# Patient Record
Sex: Male | Born: 1960 | Race: White | Hispanic: No | Marital: Married | Smoking: Former smoker
Health system: Southern US, Community
[De-identification: ages and names within clinical notes are randomized; demographics above are authoritative.]

## PROBLEM LIST (undated history)

## (undated) DIAGNOSIS — G43909 Migraine, unspecified, not intractable, without status migrainosus: Secondary | ICD-10-CM

## (undated) DIAGNOSIS — I10 Essential (primary) hypertension: Secondary | ICD-10-CM

## (undated) DIAGNOSIS — K219 Gastro-esophageal reflux disease without esophagitis: Secondary | ICD-10-CM

## (undated) DIAGNOSIS — R42 Dizziness and giddiness: Secondary | ICD-10-CM

## (undated) DIAGNOSIS — J302 Other seasonal allergic rhinitis: Secondary | ICD-10-CM

## (undated) HISTORY — PX: RHINOPLASTY: SUR1284

## (undated) HISTORY — PX: HERNIA REPAIR: SHX51

---

## 2010-03-13 ENCOUNTER — Emergency Department (HOSPITAL_COMMUNITY): Admission: EM | Admit: 2010-03-13 | Discharge: 2010-03-13 | Payer: Self-pay | Admitting: Family Medicine

## 2010-08-09 ENCOUNTER — Ambulatory Visit: Payer: Self-pay | Admitting: Gastroenterology

## 2015-01-01 ENCOUNTER — Emergency Department (HOSPITAL_COMMUNITY)
Admission: EM | Admit: 2015-01-01 | Discharge: 2015-01-01 | Disposition: A | Discharge status: Home / Self Care | Payer: 59 | Source: Home / Self Care | Attending: Emergency Medicine | Admitting: Emergency Medicine

## 2015-01-01 ENCOUNTER — Encounter (HOSPITAL_COMMUNITY): Payer: Self-pay | Admitting: *Deleted

## 2015-01-01 DIAGNOSIS — N200 Calculus of kidney: Secondary | ICD-10-CM

## 2015-01-01 HISTORY — DX: Other seasonal allergic rhinitis: J30.2

## 2015-01-01 HISTORY — DX: Gastro-esophageal reflux disease without esophagitis: K21.9

## 2015-01-01 LAB — POCT URINALYSIS DIP (DEVICE)
Bilirubin Urine: NEGATIVE
Glucose, UA: NEGATIVE mg/dL
KETONES UR: NEGATIVE mg/dL
Leukocytes, UA: NEGATIVE
NITRITE: NEGATIVE
Protein, ur: NEGATIVE mg/dL
Specific Gravity, Urine: 1.02 (ref 1.005–1.030)
UROBILINOGEN UA: 0.2 mg/dL (ref 0.0–1.0)
pH: 5 (ref 5.0–8.0)

## 2015-01-01 MED ORDER — HYDROCODONE-ACETAMINOPHEN 5-325 MG PO TABS: 1.0000 | ORAL_TABLET | Freq: Four times a day (QID) | ORAL | Status: DC | PRN

## 2015-01-01 MED ORDER — TAMSULOSIN HCL 0.4 MG PO CAPS: 0.4000 mg | ORAL_CAPSULE | Freq: Every day | ORAL | Status: DC

## 2015-01-01 MED ORDER — IBUPROFEN 800 MG PO TABS: 800.0000 mg | ORAL_TABLET | Freq: Three times a day (TID) | ORAL | Status: DC | PRN

## 2015-01-01 NOTE — ED Notes (Signed)
Pt  Reports       l  Flank   Pain     Since  Early  This  Am   -   Pt  denys  Any  specefic  Injury            - pt is   Awake  And  Alert  And  Oriented  He  Ambulated  To room  With a  Steady  Fluid  Gait         He  Is  Awake  And  Alert  And  Oriented

## 2015-01-01 NOTE — ED Provider Notes (Signed)
CSN: [id number]     Arrival date & time 01/01/15  8768 History   First MD Initiated Contact with Patient 01/01/15 1053     Chief Complaint  Patient presents with  . Flank Pain   (Consider location/radiation/quality/duration/timing/severity/associated sxs/prior Treatment) HPI He is a 54 year old man here for evaluation of left flank pain. He states he had several hours of left flank pain on Wednesday. He didn't think anything of it if he has some intermittent issues with back pain. Then, this morning he was woken at 4 AM by left flank pain that radiated around his side. He also reports feeling the urge to urinate, but unable to pass urine. This persisted for several hours, and then improved spontaneously. He has been able to void without difficulty for the last several hours. No fevers or chills. No dysuria. He has not seen any gross blood in his urine.  Past Medical History  Diagnosis Date  . GERD (gastroesophageal reflux disease)   . Seasonal allergies    Past Surgical History  Procedure Laterality Date  . Hernia repair    . Rhinoplasty     No family history on file. History  Substance Use Topics  . Smoking status: Never Smoker   . Smokeless tobacco: Not on file  . Alcohol Use: No    Review of Systems As in history of present illness Allergies  Review of patient's allergies indicates no known allergies.  Home Medications   Prior to Admission medications   Medication Sig Start Date End Date Taking? Authorizing Provider  ATENOLOL PO Take by mouth.   Yes Historical Provider, MD  GLUCOSAMINE HCL PO Take by mouth.   Yes Historical Provider, MD  Omega-3 Fatty Acids (FISH OIL PO) Take by mouth.   Yes Historical Provider, MD  Rosuvastatin Calcium (CRESTOR PO) Take by mouth.   Yes Historical Provider, MD  HYDROcodone-acetaminophen (NORCO) 5-325 MG per tablet Take 1 tablet by mouth every 6 (six) hours as needed for moderate pain. 01/01/15   Melony Overly, MD  ibuprofen (ADVIL,MOTRIN)  800 MG tablet Take 1 tablet (800 mg total) by mouth every 8 (eight) hours as needed. 01/01/15   Melony Overly, MD  tamsulosin (FLOMAX) 0.4 MG CAPS capsule Take 1 capsule (0.4 mg total) by mouth daily. 01/01/15   Melony Overly, MD   There were no vitals taken for this visit. Physical Exam  Constitutional: He is oriented to person, place, and time. He appears well-developed and well-nourished. No distress.  Cardiovascular: Normal rate.   Pulmonary/Chest: Effort normal.  Abdominal: Soft. Bowel sounds are normal. He exhibits no distension. There is no tenderness. There is no rebound and no guarding.  No CVA tenderness  Neurological: He is alert and oriented to person, place, and time.    ED Course  Procedures (including critical care time) Labs Review Labs Reviewed  POCT URINALYSIS DIP (DEVICE) - Abnormal; Notable for the following:    Hgb urine dipstick MODERATE (*)    All other components within normal limits    Imaging Review No results found.   MDM   1. Kidney stone on left side    I suspect he spontaneously passed a kidney stone this morning. We'll treat with one week of Flomax. Ibuprofen and Norco for pain. Return precautions reviewed.    Melony Overly, MD 01/01/15 [phone]

## 2015-01-01 NOTE — Discharge Instructions (Signed)
It sounds like you had a kidney stone. I think you have likely passed a kidney stone. Just in case, take Flomax daily for 1 week. Use ibuprofen 800 mg every 8 hours as needed for pain. You can use the Norco every 4-6 hours as needed for severe pain. If that pain comes back and you are unable to control at home, you have vomiting, or you are unable to urinate for 12 hours, please go to [facility] emergency room.

## 2015-09-18 MED FILL — ROSUVASTATIN CALCIUM 40 MG: 40 | 90 days supply | Qty: 90 | Fill #2

## 2015-09-18 MED FILL — ATENOLOL 50 MG TABLET: 50 | 90 days supply | Qty: 90 | Fill #0

## 2015-09-18 MED FILL — PANTOPRAZOLE SOD DR 40 MG T: 40 | 90 days supply | Qty: 90 | Fill #1

## 2015-09-18 MED FILL — FENOFIBRATE 160 MG TABLET: 160 | 90 days supply | Qty: 90 | Fill #3

## 2015-10-31 MED FILL — BUTALBITAL/APAP/CAFFEINE TB: 50-325-40 | 50 days supply | Qty: 50 | Fill #1

## 2015-12-11 MED FILL — ATENOLOL 50 MG TABLET: 50 | 90 days supply | Qty: 90 | Fill #1

## 2015-12-11 MED FILL — FENOFIBRATE 160 MG TABLET: 160 | 90 days supply | Qty: 90 | Fill #0

## 2015-12-11 MED FILL — PANTOPRAZOLE SOD DR 40 MG T: 40 | 90 days supply | Qty: 90 | Fill #2

## 2015-12-11 MED FILL — ROSUVASTATIN CALCIUM 40 MG: 40 | 90 days supply | Qty: 90 | Fill #3

## 2016-01-01 DIAGNOSIS — H5203 Hypermetropia, bilateral: Secondary | ICD-10-CM | POA: Diagnosis not present

## 2016-02-26 DIAGNOSIS — J309 Allergic rhinitis, unspecified: Secondary | ICD-10-CM | POA: Diagnosis not present

## 2016-02-26 DIAGNOSIS — R739 Hyperglycemia, unspecified: Secondary | ICD-10-CM | POA: Diagnosis not present

## 2016-02-26 DIAGNOSIS — Z6830 Body mass index (BMI) 30.0-30.9, adult: Secondary | ICD-10-CM | POA: Diagnosis not present

## 2016-02-26 DIAGNOSIS — E668 Other obesity: Secondary | ICD-10-CM | POA: Diagnosis not present

## 2016-02-26 DIAGNOSIS — Z1389 Encounter for screening for other disorder: Secondary | ICD-10-CM | POA: Diagnosis not present

## 2016-02-26 DIAGNOSIS — I10 Essential (primary) hypertension: Secondary | ICD-10-CM | POA: Diagnosis not present

## 2016-02-26 DIAGNOSIS — R945 Abnormal results of liver function studies: Secondary | ICD-10-CM | POA: Diagnosis not present

## 2016-02-26 DIAGNOSIS — H9319 Tinnitus, unspecified ear: Secondary | ICD-10-CM | POA: Diagnosis not present

## 2016-02-26 DIAGNOSIS — E784 Other hyperlipidemia: Secondary | ICD-10-CM | POA: Diagnosis not present

## 2016-02-29 MED FILL — BUTALB-ACETAMIN-CAFF 50-325: 50-325-40 | 50 days supply | Qty: 50 | Fill #2

## 2016-02-29 MED FILL — FLUTICASONE PROP 50 MCG SPR: 50 | 90 days supply | Qty: 48 | Fill #0

## 2016-03-15 MED FILL — FENOFIBRATE 160 MG TABLET: 160 | 90 days supply | Qty: 90 | Fill #1

## 2016-03-15 MED FILL — PANTOPRAZOLE SOD DR 40 MG T: 40 | 90 days supply | Qty: 90 | Fill #3

## 2016-03-15 MED FILL — ATENOLOL 50 MG TABLET: 50 | 90 days supply | Qty: 90 | Fill #2

## 2016-03-15 MED FILL — ROSUVASTATIN CALCIUM 40 MG: 40 | 90 days supply | Qty: 90 | Fill #0

## 2016-06-17 MED FILL — ROSUVASTATIN CALCIUM 40 MG: 40 | 90 days supply | Qty: 90 | Fill #1

## 2016-06-17 MED FILL — PANTOPRAZOLE SOD DR 40 MG T: 40 | 90 days supply | Qty: 90 | Fill #0

## 2016-06-17 MED FILL — ATENOLOL 50 MG TABLET: 50 | 90 days supply | Qty: 90 | Fill #0

## 2016-06-17 MED FILL — FENOFIBRATE 160 MG TABLET: 160 | 90 days supply | Qty: 90 | Fill #2

## 2016-07-30 MED FILL — BUTALBITAL/APAP/CAFFEINE TB: 50-325-40 | 45 days supply | Qty: 45 | Fill #0

## 2016-08-28 DIAGNOSIS — I10 Essential (primary) hypertension: Secondary | ICD-10-CM | POA: Diagnosis not present

## 2016-08-28 DIAGNOSIS — Z125 Encounter for screening for malignant neoplasm of prostate: Secondary | ICD-10-CM | POA: Diagnosis not present

## 2016-08-28 DIAGNOSIS — R7309 Other abnormal glucose: Secondary | ICD-10-CM | POA: Diagnosis not present

## 2016-08-29 DIAGNOSIS — Z0000 Encounter for general adult medical examination without abnormal findings: Secondary | ICD-10-CM | POA: Diagnosis not present

## 2016-08-29 DIAGNOSIS — R945 Abnormal results of liver function studies: Secondary | ICD-10-CM | POA: Diagnosis not present

## 2016-08-29 DIAGNOSIS — J3089 Other allergic rhinitis: Secondary | ICD-10-CM | POA: Diagnosis not present

## 2016-08-29 DIAGNOSIS — H9313 Tinnitus, bilateral: Secondary | ICD-10-CM | POA: Diagnosis not present

## 2016-08-29 DIAGNOSIS — K219 Gastro-esophageal reflux disease without esophagitis: Secondary | ICD-10-CM | POA: Diagnosis not present

## 2016-08-29 DIAGNOSIS — Z1389 Encounter for screening for other disorder: Secondary | ICD-10-CM | POA: Diagnosis not present

## 2016-08-29 DIAGNOSIS — I10 Essential (primary) hypertension: Secondary | ICD-10-CM | POA: Diagnosis not present

## 2016-08-29 DIAGNOSIS — M79673 Pain in unspecified foot: Secondary | ICD-10-CM | POA: Diagnosis not present

## 2016-08-29 DIAGNOSIS — R7309 Other abnormal glucose: Secondary | ICD-10-CM | POA: Diagnosis not present

## 2016-08-29 DIAGNOSIS — E784 Other hyperlipidemia: Secondary | ICD-10-CM | POA: Diagnosis not present

## 2016-09-19 MED FILL — ROSUVASTATIN CALCIUM 40 MG: 40 | 90 days supply | Qty: 90 | Fill #2

## 2016-09-19 MED FILL — PANTOPRAZOLE SOD DR 40 MG T: 40 | 90 days supply | Qty: 90 | Fill #1

## 2016-09-19 MED FILL — FENOFIBRATE 160 MG TABLET: 160 | 90 days supply | Qty: 90 | Fill #3

## 2016-09-19 MED FILL — ATENOLOL 50 MG TABLET: 50 | 90 days supply | Qty: 90 | Fill #1

## 2016-12-25 MED FILL — BUTALBITAL/APAP/CAFFEINE TB: 50-325-40 | 45 days supply | Qty: 45 | Fill #1

## 2016-12-25 MED FILL — ROSUVASTATIN CALCIUM 40 MG: 40 | 90 days supply | Qty: 90 | Fill #3

## 2016-12-25 MED FILL — ATENOLOL 50 MG TABLET: 50 | 90 days supply | Qty: 90 | Fill #2

## 2016-12-25 MED FILL — FENOFIBRATE 160 MG TABLET: 160 | 90 days supply | Qty: 90 | Fill #0

## 2016-12-25 MED FILL — PANTOPRAZOLE SOD DR 40 MG T: 40 | 90 days supply | Qty: 90 | Fill #2

## 2017-03-03 DIAGNOSIS — I10 Essential (primary) hypertension: Secondary | ICD-10-CM | POA: Diagnosis not present

## 2017-03-03 DIAGNOSIS — E784 Other hyperlipidemia: Secondary | ICD-10-CM | POA: Diagnosis not present

## 2017-03-03 DIAGNOSIS — Z6831 Body mass index (BMI) 31.0-31.9, adult: Secondary | ICD-10-CM | POA: Diagnosis not present

## 2017-03-03 DIAGNOSIS — M79673 Pain in unspecified foot: Secondary | ICD-10-CM | POA: Diagnosis not present

## 2017-03-03 DIAGNOSIS — R51 Headache: Secondary | ICD-10-CM | POA: Diagnosis not present

## 2017-03-03 DIAGNOSIS — E668 Other obesity: Secondary | ICD-10-CM | POA: Diagnosis not present

## 2017-03-25 MED FILL — ROSUVASTATIN CALCIUM 40 MG: 40 | 90 days supply | Qty: 90 | Fill #0

## 2017-03-25 MED FILL — BUTALBITAL/APAP/CAFFEINE TB: 50-325-40 | 45 days supply | Qty: 45 | Fill #2

## 2017-03-25 MED FILL — FENOFIBRATE 160 MG TABLET: 160 | 90 days supply | Qty: 90 | Fill #1

## 2017-03-25 MED FILL — PANTOPRAZOLE SOD DR 40 MG T: 40 | 90 days supply | Qty: 90 | Fill #0

## 2017-03-25 MED FILL — ATENOLOL 50 MG TABLET: 50 | 90 days supply | Qty: 90 | Fill #0

## 2017-05-20 MED FILL — FLUTICASONE PROP 50 MCG SPR: 50 | 90 days supply | Qty: 48 | Fill #0

## 2017-06-30 MED FILL — ATENOLOL 50 MG TABLET: 50 | 90 days supply | Qty: 90 | Fill #1

## 2017-06-30 MED FILL — FENOFIBRATE 160 MG TABLET: 160 | 90 days supply | Qty: 90 | Fill #2

## 2017-06-30 MED FILL — PANTOPRAZOLE SOD DR 40 MG T: 40 | 90 days supply | Qty: 90 | Fill #1

## 2017-06-30 MED FILL — BUTALB-ACETAMIN-CAFF 50-325: 50-325-40 | 45 days supply | Qty: 45 | Fill #3

## 2017-06-30 MED FILL — ROSUVASTATIN CALCIUM 40 MG: 40 | 90 days supply | Qty: 90 | Fill #1

## 2017-07-28 ENCOUNTER — Ambulatory Visit (INDEPENDENT_AMBULATORY_CARE_PROVIDER_SITE_OTHER): Payer: 59

## 2017-07-28 ENCOUNTER — Ambulatory Visit: Payer: 59 | Admitting: Podiatry

## 2017-07-28 DIAGNOSIS — M722 Plantar fascial fibromatosis: Secondary | ICD-10-CM | POA: Diagnosis not present

## 2017-07-28 NOTE — Progress Notes (Signed)
   Subjective:    Patient ID: Jackson Vaughan, male    DOB: [birth year], 56 y.o.   MRN: [id number]  HPI: He presents today with his wife with a history of plantar fasciitis. States that his feet really bother him after he gets off work by the end of the day they are sore and tired. He goes on to say that he has developed pain in his legs and knees in the evening and he feels that this may be associated with his lack of control from his orthotics.he states that he is also noticed that his foot flattens out and turns out to the right as he walks. He is referring to his right foot. He shows me his orthotics today which are nothing more than the comminuted orthotics produced by his chiropractor. He states they seem to help but they do not alleviate all of his symptoms. He would like to consider a different orthotic. He states that he will has to wear safety shoes at work. He works for [employer].    Review of Systems  All other systems reviewed and are negative.      Objective:   Physical Exam : Vital signs are stable he is alert and oriented 3 in no apparent distress.pulses are strongly palpable. Neurologic sensorium is intact per Semmes-Weinstein monofilament. Deep tendon reflexes are brisk and equal symmetrical bilateral muscle strength is +5/5 dorsiflexors plantar flexors inverters and everters bilaterally. He has minimal reproduced production of pain today on palpation of medial calcaneal tubercles bilaterally. taneous evaluation does not demonstrate any type of cutaneous abnormality.uc      Assessment & Plan:  Plantar fasciitis and pronation.  Plan: He has an appointment with Liliane Channel to be casted for orthotics.

## 2017-07-30 ENCOUNTER — Ambulatory Visit: Payer: Self-pay | Admitting: Urology

## 2017-08-04 ENCOUNTER — Ambulatory Visit (INDEPENDENT_AMBULATORY_CARE_PROVIDER_SITE_OTHER): Payer: 59 | Admitting: Orthotics

## 2017-08-04 DIAGNOSIS — M722 Plantar fascial fibromatosis: Secondary | ICD-10-CM | POA: Diagnosis not present

## 2017-08-04 NOTE — Progress Notes (Signed)

## 2017-08-21 ENCOUNTER — Encounter: Payer: Self-pay | Admitting: Urology

## 2017-08-21 ENCOUNTER — Ambulatory Visit: Payer: 59 | Admitting: Urology

## 2017-08-21 VITALS — BP 141/78 | HR 64 | Ht 67.0 in | Wt 203.0 lb

## 2017-08-21 DIAGNOSIS — R6882 Decreased libido: Secondary | ICD-10-CM

## 2017-08-21 DIAGNOSIS — R5383 Other fatigue: Secondary | ICD-10-CM

## 2017-08-21 DIAGNOSIS — N528 Other male erectile dysfunction: Secondary | ICD-10-CM | POA: Diagnosis not present

## 2017-08-21 NOTE — Progress Notes (Signed)
Imaging  08/21/2017 3:25 PM   Jackson Vaughan 08-15-1961 [id number]   Chief Complaint  Patient presents with  . New Patient (Initial Visit)    HPI: 56 year old male presents for evaluation of decreased libido and erectile dysfunction.  He presents with an approximately 2-year history of difficulty achieving and maintaining erection.  He has partial erections which are typically firm enough for penetration however he will typically lose prior to ejaculation.  He has migraines and is reluctant to try PDE 5 inhibitors secondary to headaches.  He also complains of decreased libido, tiredness and fatigue.  He has not had a prior evaluation and has not had his testosterone level checked.  Organic risk factors include hypertension, beta-blocker medication, hyperlipidemia and previous smoking history.  He denies bothersome lower urinary tract symptoms.  He has nocturia x0-1.  He denies flank, abdominal, pelvic or scrotal pain.  Denies dysuria or gross hematuria.  Past urologic history remarkable for stone disease.   PMH: Past Medical History:  Diagnosis Date  . GERD (gastroesophageal reflux disease)   . Seasonal allergies     Surgical History: Past Surgical History:  Procedure Laterality Date  . HERNIA REPAIR    . RHINOPLASTY      Home Medications:  Allergies as of 08/21/2017   No Known Allergies     Medication List        Accurate as of 08/21/17  3:25 PM. Always use your most recent med list.          ATENOLOL PO Take by mouth.   CRESTOR PO Take by mouth.   fenofibrate 54 MG tablet Take 54 mg daily by mouth.   FISH OIL PO Take by mouth.   vitamin C 500 MG tablet Commonly known as:  ASCORBIC ACID Take 500 mg daily by mouth.   vitamin E 400 UNIT capsule Take 400 Units daily by mouth.       Allergies: No Known Allergies  Family History: Family History  Problem Relation Age of Onset  . Kidney cancer Neg Hx   . Kidney disease Neg Hx   . Prostate cancer Neg  Hx     Social History:  reports that  has never smoked. he has never used smokeless tobacco. He reports that he does not drink alcohol. His drug history is not on file.  ROS: UROLOGY Frequent Urination?: No Hard to postpone urination?: No Burning/pain with urination?: No Get up at night to urinate?: No Leakage of urine?: No Urine stream starts and stops?: No Trouble starting stream?: No Do you have to strain to urinate?: No Blood in urine?: No Urinary tract infection?: No Sexually transmitted disease?: No Injury to kidneys or bladder?: No Painful intercourse?: No Weak stream?: No Erection problems?: Yes Penile pain?: No  Gastrointestinal Nausea?: No Vomiting?: No Indigestion/heartburn?: Yes Diarrhea?: No Constipation?: No  Constitutional Fever: No Night sweats?: No Weight loss?: No Fatigue?: Yes  Skin Skin rash/lesions?: No Itching?: No  Eyes Blurred vision?: No Double vision?: No  Ears/Nose/Throat Sore throat?: No Sinus problems?: Yes  Hematologic/Lymphatic Swollen glands?: No Easy bruising?: No  Cardiovascular Leg swelling?: No Chest pain?: No  Respiratory Cough?: No Shortness of breath?: No  Endocrine Excessive thirst?: No  Musculoskeletal Back pain?: Yes Joint pain?: No  Neurological Headaches?: Yes Dizziness?: No  Psychologic Depression?: No Anxiety?: No  Physical Exam: BP (!) 141/78   Pulse 64   Ht 5\' 7"  (1.702 m)   Wt 203 lb (92.1 kg)   BMI 31.79 kg/m  Constitutional:  Alert and oriented, No acute distress. HEENT: [place] AT, moist mucus membranes.  Trachea midline, no masses. Cardiovascular: No clubbing, cyanosis, or edema. Respiratory: Normal respiratory effort, no increased work of breathing. GI: Abdomen is soft, nontender, nondistended, no abdominal masses GU: No CVA tenderness.  Penis without lesions; testes descended bilaterally without masses or tenderness.  Prostate 35 g, smooth without nodules. Skin: No rashes,  bruises or suspicious lesions. Lymph: No cervical or inguinal adenopathy. Neurologic: Grossly intact, no focal deficits, moving all 4 extremities. Psychiatric: Normal mood and affect.   Assessment & Plan:    1. Other male erectile dysfunction I discussed other PDE 5 inhibitors which have less side effects of headache/flushing however he wanted to hold off.  - Testosterone - Luteinizing hormone - Sex hormone binding globulin  2. Low libido He will return for an a.m. testosterone, LH,SHBG  - Testosterone - Luteinizing hormone - Sex hormone binding globulin  3. Fatigue, unspecified type  - Testosterone - Luteinizing hormone - Sex hormone binding globulin   [name], No Name [address] [place], [place] [address] [phone]

## 2017-08-27 ENCOUNTER — Other Ambulatory Visit: Payer: Self-pay

## 2017-08-27 ENCOUNTER — Other Ambulatory Visit: Payer: 59

## 2017-08-27 DIAGNOSIS — E349 Endocrine disorder, unspecified: Secondary | ICD-10-CM

## 2017-08-28 LAB — FSH/LH
FSH: 2.2 m[IU]/mL (ref 1.5–12.4)
LH: 5.4 m[IU]/mL (ref 1.7–8.6)

## 2017-08-28 LAB — TESTOSTERONE: TESTOSTERONE: 221 ng/dL — AB (ref 264–916)

## 2017-08-28 LAB — SEX HORMONE BINDING GLOBULIN: Sex Hormone Binding: 28.8 nmol/L (ref 19.3–76.4)

## 2017-09-03 ENCOUNTER — Telehealth: Payer: Self-pay

## 2017-09-03 NOTE — Telephone Encounter (Signed)
-----   Message from [name], MD sent at 09/02/2017  8:24 AM EST ----- Testosterone level was low at 221.  He will need a repeat AM testosterone level to verify and a follow-up appointment to discuss treatment options

## 2017-09-03 NOTE — Telephone Encounter (Signed)
Patient's wife notified , patient will call back to schedule apts

## 2017-09-04 ENCOUNTER — Other Ambulatory Visit: Payer: 59

## 2017-09-04 DIAGNOSIS — E349 Endocrine disorder, unspecified: Secondary | ICD-10-CM | POA: Diagnosis not present

## 2017-09-05 LAB — TESTOSTERONE: Testosterone: 185 ng/dL — ABNORMAL LOW (ref 264–916)

## 2017-09-08 ENCOUNTER — Ambulatory Visit: Payer: 59 | Admitting: Orthotics

## 2017-09-08 DIAGNOSIS — M722 Plantar fascial fibromatosis: Secondary | ICD-10-CM

## 2017-09-08 NOTE — Progress Notes (Signed)
Patient came in today to pick up custom made foot orthotics.  The goals were accomplished and the patient reported no dissatisfaction with said orthotics.  Patient was advised of breakin period and how to report any issues. 

## 2017-09-12 ENCOUNTER — Telehealth: Payer: Self-pay

## 2017-09-12 NOTE — Telephone Encounter (Signed)
-----   Message from [name], MD sent at 09/11/2017  9:18 AM EST ----- Repeat testosterone level is low.  Recommend follow-up appointment to discuss treatment options.

## 2017-09-12 NOTE — Telephone Encounter (Signed)
LMOM

## 2017-09-15 ENCOUNTER — Other Ambulatory Visit: Payer: 59

## 2017-09-15 DIAGNOSIS — R82998 Other abnormal findings in urine: Secondary | ICD-10-CM | POA: Diagnosis not present

## 2017-09-15 DIAGNOSIS — Z125 Encounter for screening for malignant neoplasm of prostate: Secondary | ICD-10-CM | POA: Diagnosis not present

## 2017-09-15 DIAGNOSIS — E7849 Other hyperlipidemia: Secondary | ICD-10-CM | POA: Diagnosis not present

## 2017-09-15 DIAGNOSIS — R7309 Other abnormal glucose: Secondary | ICD-10-CM | POA: Diagnosis not present

## 2017-09-15 DIAGNOSIS — I10 Essential (primary) hypertension: Secondary | ICD-10-CM | POA: Diagnosis not present

## 2017-09-15 DIAGNOSIS — Z0000 Encounter for general adult medical examination without abnormal findings: Secondary | ICD-10-CM | POA: Diagnosis not present

## 2017-09-16 NOTE — Telephone Encounter (Signed)
Pt returned call and appt made

## 2017-09-22 ENCOUNTER — Other Ambulatory Visit: Payer: Self-pay

## 2017-09-22 DIAGNOSIS — E668 Other obesity: Secondary | ICD-10-CM | POA: Diagnosis not present

## 2017-09-22 DIAGNOSIS — J3089 Other allergic rhinitis: Secondary | ICD-10-CM | POA: Diagnosis not present

## 2017-09-22 DIAGNOSIS — E7849 Other hyperlipidemia: Secondary | ICD-10-CM | POA: Diagnosis not present

## 2017-09-22 DIAGNOSIS — F5221 Male erectile disorder: Secondary | ICD-10-CM | POA: Diagnosis not present

## 2017-09-22 DIAGNOSIS — K219 Gastro-esophageal reflux disease without esophagitis: Secondary | ICD-10-CM | POA: Diagnosis not present

## 2017-09-22 DIAGNOSIS — R5383 Other fatigue: Secondary | ICD-10-CM | POA: Diagnosis not present

## 2017-09-22 DIAGNOSIS — I10 Essential (primary) hypertension: Secondary | ICD-10-CM | POA: Diagnosis not present

## 2017-09-22 DIAGNOSIS — R6882 Decreased libido: Secondary | ICD-10-CM | POA: Diagnosis not present

## 2017-09-22 DIAGNOSIS — Z1389 Encounter for screening for other disorder: Secondary | ICD-10-CM | POA: Diagnosis not present

## 2017-09-22 DIAGNOSIS — Z0000 Encounter for general adult medical examination without abnormal findings: Secondary | ICD-10-CM | POA: Diagnosis not present

## 2017-09-29 MED FILL — PANTOPRAZOLE SOD DR 40 MG T: 40 | 90 days supply | Qty: 90 | Fill #2

## 2017-09-29 MED FILL — FENOFIBRATE 160 MG TABLET: 160 | 90 days supply | Qty: 90 | Fill #3

## 2017-09-29 MED FILL — ROSUVASTATIN CALCIUM 40 MG: 40 | 90 days supply | Qty: 90 | Fill #2

## 2017-09-29 MED FILL — ATENOLOL 50 MG TABLET: 50 | 90 days supply | Qty: 90 | Fill #2

## 2017-10-06 ENCOUNTER — Encounter: Payer: Self-pay | Admitting: Urology

## 2017-10-06 ENCOUNTER — Ambulatory Visit: Payer: 59 | Admitting: Urology

## 2017-10-06 VITALS — BP 117/52 | HR 67 | Ht 67.0 in | Wt 201.0 lb

## 2017-10-06 DIAGNOSIS — E291 Testicular hypofunction: Secondary | ICD-10-CM | POA: Diagnosis not present

## 2017-10-06 MED ORDER — TESTOSTERONE CYPIONATE 200 MG/ML IM SOLN: 200.0000 mg | INTRAMUSCULAR | 0 refills | Status: DC

## 2017-10-06 NOTE — Progress Notes (Signed)
10/06/2017 3:22 PM   Jackson Vaughan Feb 06, 1961 [id number]  Referring provider: Shon Baton, MD [address] [place], [place] [address]  Chief Complaint  Patient presents with  . Hypogonadism    discuss treatment    HPI: 57 year old male presents for follow-up.  He was initially seen on 08/21/2017 complaining of decreased libido and erectile dysfunction. Testosterone levels were low at 221 and 185 ng/dL.  LH was normal at 5.4  PMH: Past Medical History:  Diagnosis Date  . GERD (gastroesophageal reflux disease)   . Seasonal allergies     Surgical History: Past Surgical History:  Procedure Laterality Date  . HERNIA REPAIR    . RHINOPLASTY      Home Medications:  Allergies as of 10/06/2017   No Known Allergies     Medication List        Accurate as of 10/06/17  3:22 PM. Always use your most recent med list.          ATENOLOL PO Take by mouth.   CRESTOR PO Take by mouth.   fenofibrate 160 MG tablet Take 160 mg by mouth daily.   FISH OIL PO Take by mouth.   pantoprazole 40 MG tablet Commonly known as:  PROTONIX Take 40 mg by mouth daily.   vitamin C 500 MG tablet Commonly known as:  ASCORBIC ACID Take 500 mg daily by mouth.   vitamin E 400 UNIT capsule Take 400 Units daily by mouth.       Allergies: No Known Allergies  Family History: Family History  Problem Relation Age of Onset  . Kidney cancer Neg Hx   . Kidney disease Neg Hx   . Prostate cancer Neg Hx     Social History:  reports that  has never smoked. he has never used smokeless tobacco. He reports that he does not drink alcohol. His drug history is not on file.  ROS: UROLOGY Frequent Urination?: No Hard to postpone urination?: No Burning/pain with urination?: No Get up at night to urinate?: No Leakage of urine?: No Urine stream starts and stops?: No Trouble starting stream?: No Do you have to strain to urinate?: No Blood in urine?: No Urinary tract infection?:  No Sexually transmitted disease?: No Injury to kidneys or bladder?: No Painful intercourse?: No Weak stream?: No Erection problems?: No Penile pain?: No  Gastrointestinal Nausea?: No Vomiting?: No Indigestion/heartburn?: No Diarrhea?: No Constipation?: No  Constitutional Fever: No Night sweats?: No Weight loss?: No Fatigue?: No  Skin Skin rash/lesions?: No Itching?: No  Eyes Blurred vision?: No Double vision?: No  Ears/Nose/Throat Sore throat?: No Sinus problems?: Yes  Hematologic/Lymphatic Swollen glands?: No Easy bruising?: No  Cardiovascular Leg swelling?: No Chest pain?: No  Respiratory Cough?: No Shortness of breath?: No  Endocrine Excessive thirst?: No  Musculoskeletal Back pain?: Yes Joint pain?: No  Neurological Headaches?: Yes Dizziness?: No  Psychologic Depression?: No Anxiety?: No  Physical Exam: BP (!) 117/52   Pulse 67   Ht 5\' 7"  (1.702 m)   Wt 201 lb (91.2 kg)   BMI 31.48 kg/m    Constitutional:  Alert and oriented, No acute distress. HEENT: [place] AT, moist mucus membranes.  Trachea midline, no masses. Cardiovascular: No clubbing, cyanosis, or edema. Respiratory: Normal respiratory effort, no increased work of breathing. GI: Abdomen is soft, nontender, nondistended, no abdominal masses GU: No CVA tenderness.  Skin: No rashes, bruises or suspicious lesions. Lymph: No cervical or inguinal adenopathy. Neurologic: Grossly intact, no focal deficits, moving all 4 extremities.  Psychiatric: Normal mood and affect.  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  He is interested in testosterone replacement therapy.  We discussed treatment options including topical testosterone and injections.  His wife is a Marine scientist he has elected injections.  It was stressed that testosterone replacement therapy may not resolve his erectile  dysfunction and he may need additional therapy.  Potential side effects of testosterone replacement were discussed including stimulation of benign prostatic growth with lower urinary tract symptoms; erythrocytosis; edema; gynecomastia; worsening sleep apnea; venous thromboembolism; testicular atrophy and infertility. Recent studies suggesting an increased incidence of heart attack and stroke in patients taking testosterone was discussed. He was informed there is conflicting evidence regarding the impact of testosterone therapy on cardiovascular risk. The theoretical risk of growth stimulation of an undetected prostate cancer was also discussed.  He was informed that current evidence does not provide any definitive answers regarding the risks of testosterone therapy on prostate cancer and cardiovascular disease. The need for periodic monitoring of his testosterone level, PSA, hematocrit and DRE was discussed.  Will start at 200 mg every 2 weeks.  Follow-up 6 weeks for symptom reassessment and testosterone level.    [name], [place] [address], [place], [place] [address] [phone]

## 2017-10-24 MED FILL — TESTOSTERONE CYP 200 MG/ML: 200 | 84 days supply | Qty: 6 | Fill #0

## 2017-11-18 ENCOUNTER — Ambulatory Visit: Payer: 59 | Admitting: Urology

## 2017-11-25 ENCOUNTER — Other Ambulatory Visit: Payer: 59

## 2017-12-01 ENCOUNTER — Ambulatory Visit: Payer: 59 | Admitting: Urology

## 2017-12-22 ENCOUNTER — Other Ambulatory Visit: Payer: 59

## 2017-12-22 DIAGNOSIS — E291 Testicular hypofunction: Secondary | ICD-10-CM

## 2017-12-23 LAB — TESTOSTERONE: TESTOSTERONE: 816 ng/dL (ref 264–916)

## 2017-12-25 MED FILL — FENOFIBRATE 160 MG TABLET: 160 | 90 days supply | Qty: 90 | Fill #0

## 2017-12-25 MED FILL — ROSUVASTATIN CALCIUM 40 MG: 40 | 90 days supply | Qty: 90 | Fill #0

## 2017-12-25 MED FILL — ATENOLOL 50 MG TABLET: 50 | 90 days supply | Qty: 90 | Fill #0

## 2017-12-25 MED FILL — PANTOPRAZOLE SOD DR 40 MG T: 40 | 90 days supply | Qty: 90 | Fill #0

## 2017-12-29 ENCOUNTER — Ambulatory Visit: Payer: 59 | Admitting: Urology

## 2017-12-29 ENCOUNTER — Encounter: Payer: Self-pay | Admitting: Urology

## 2017-12-29 DIAGNOSIS — E291 Testicular hypofunction: Secondary | ICD-10-CM | POA: Diagnosis not present

## 2017-12-29 NOTE — Progress Notes (Signed)
12/29/2017 10:54 AM   Jackson Vaughan July 14, 1961 [id number]  Referring provider: Shon Baton, MD [address] [place], [place] [address]  Chief Complaint  Patient presents with  . Follow-up    HPI: 57 year old male presents for follow-up of hypogonadism.  He was previously seen for symptoms of decreased libido and erectile dysfunction.  He complained of decreased libido and erectile dysfunction.  He is currently on 200 mg every 2 weeks.  A testosterone level checked 1 week after his last injection was 816 ng/dL.  He feels his symptoms have improved.  He has noted increased appetite and weight gain and is wondering if this could be secondary to the testosterone.   PMH: Past Medical History:  Diagnosis Date  . GERD (gastroesophageal reflux disease)   . Seasonal allergies     Surgical History: Past Surgical History:  Procedure Laterality Date  . HERNIA REPAIR    . RHINOPLASTY      Home Medications:  Allergies as of 12/29/2017   No Known Allergies     Medication List        Accurate as of 12/29/17 10:54 AM. Always use your most recent med list.          ATENOLOL PO Take by mouth.   CRESTOR PO Take by mouth.   fenofibrate 160 MG tablet Take 160 mg by mouth daily.   FISH OIL PO Take by mouth.   pantoprazole 40 MG tablet Commonly known as:  PROTONIX Take 40 mg by mouth daily.   testosterone cypionate 200 MG/ML injection Commonly known as:  DEPOTESTOSTERONE CYPIONATE Inject 1 mL (200 mg total) into the muscle every 14 (fourteen) days.   vitamin C 500 MG tablet Commonly known as:  ASCORBIC ACID Take 500 mg daily by mouth.   vitamin E 400 UNIT capsule Take 400 Units daily by mouth.       Allergies: No Known Allergies  Family History: Family History  Problem Relation Age of Onset  . Kidney cancer Neg Hx   . Kidney disease Neg Hx   . Prostate cancer Neg Hx     Social History:  reports that he has never smoked. He has never used smokeless  tobacco. He reports that he does not drink alcohol. His drug history is not on file.  ROS: UROLOGY Frequent Urination?: No Hard to postpone urination?: No Burning/pain with urination?: No Get up at night to urinate?: No Leakage of urine?: No Urine stream starts and stops?: No Trouble starting stream?: No Do you have to strain to urinate?: No Blood in urine?: No Urinary tract infection?: No Sexually transmitted disease?: No Injury to kidneys or bladder?: No Painful intercourse?: No Weak stream?: No Erection problems?: No Penile pain?: No  Gastrointestinal Nausea?: No Vomiting?: No Indigestion/heartburn?: No Diarrhea?: No Constipation?: No  Constitutional Fever: No Night sweats?: No Weight loss?: No Fatigue?: No  Skin Skin rash/lesions?: No Itching?: No  Eyes Blurred vision?: No Double vision?: No  Ears/Nose/Throat Sore throat?: No Sinus problems?: No  Hematologic/Lymphatic Swollen glands?: No Easy bruising?: No  Cardiovascular Leg swelling?: No Chest pain?: No  Respiratory Cough?: No Shortness of breath?: No  Endocrine Excessive thirst?: No  Musculoskeletal Back pain?: No Joint pain?: No  Neurological Headaches?: No Dizziness?: No  Psychologic Depression?: No Anxiety?: No  Physical Exam: BP 133/80   Pulse 60   Resp 16   Ht 5\' 7"  (1.702 m)   Wt 208 lb 12.8 oz (94.7 kg)   SpO2 98%   BMI  32.70 kg/m   Constitutional:  Alert and oriented, No acute distress. HEENT: [place] AT, moist mucus membranes.  Trachea midline, no masses. Cardiovascular: No clubbing, cyanosis, or edema. Respiratory: Normal respiratory effort, no increased work of breathing. GI: Abdomen is soft, nontender, nondistended, no abdominal masses GU: No CVA tenderness.  Prostate 35 g, smooth without nodules. Lymph: No cervical or inguinal lymphadenopathy. Skin: No rashes, bruises or suspicious lesions. Neurologic: Grossly intact, no focal deficits, moving all 4  extremities. Psychiatric: Normal mood and affect.  Laboratory Data:  Lab Results  Component Value Date   TESTOSTERONE 816 12/22/2017    Assessment & Plan:   Significant improvement in his testosterone level.  He desires to continue replacement.  He will be due for a testosterone level, PSA and hematocrit in approximately 6 weeks.  Follow-up with me in 6 months.   [name], [place] [address], [place], [place] [address] [phone]

## 2017-12-30 MED FILL — TESTOSTERONE CYP 200 MG/ML: 200 | 55 days supply | Qty: 4 | Fill #1

## 2017-12-30 MED FILL — FLUTICASONE PROP 50 MCG SPR: 50 | 90 days supply | Qty: 48 | Fill #1

## 2018-01-15 MED FILL — BUTALB-ACETAMIN-CAFF 50-325: 50-325-40 | 45 days supply | Qty: 45 | Fill #0

## 2018-03-09 DIAGNOSIS — R739 Hyperglycemia, unspecified: Secondary | ICD-10-CM | POA: Diagnosis not present

## 2018-03-09 DIAGNOSIS — R5383 Other fatigue: Secondary | ICD-10-CM | POA: Diagnosis not present

## 2018-03-09 DIAGNOSIS — I10 Essential (primary) hypertension: Secondary | ICD-10-CM | POA: Diagnosis not present

## 2018-03-09 DIAGNOSIS — F5221 Male erectile disorder: Secondary | ICD-10-CM | POA: Diagnosis not present

## 2018-03-09 DIAGNOSIS — E7849 Other hyperlipidemia: Secondary | ICD-10-CM | POA: Diagnosis not present

## 2018-03-09 DIAGNOSIS — R6882 Decreased libido: Secondary | ICD-10-CM | POA: Diagnosis not present

## 2018-03-09 DIAGNOSIS — E298 Other testicular dysfunction: Secondary | ICD-10-CM | POA: Diagnosis not present

## 2018-03-09 DIAGNOSIS — Z23 Encounter for immunization: Secondary | ICD-10-CM | POA: Diagnosis not present

## 2018-03-09 DIAGNOSIS — E668 Other obesity: Secondary | ICD-10-CM | POA: Diagnosis not present

## 2018-03-18 ENCOUNTER — Other Ambulatory Visit: Payer: Self-pay | Admitting: Family Medicine

## 2018-03-18 MED FILL — PANTOPRAZOLE SOD DR 40 MG T: 40 | 90 days supply | Qty: 90 | Fill #1

## 2018-03-18 MED FILL — FENOFIBRATE 160 MG TABLET: 160 | 90 days supply | Qty: 90 | Fill #1

## 2018-03-18 MED FILL — ATENOLOL 50 MG TABLET: 50 | 90 days supply | Qty: 90 | Fill #1

## 2018-03-18 MED FILL — ROSUVASTATIN CALCIUM 40 MG: 40 | 90 days supply | Qty: 90 | Fill #1

## 2018-03-21 MED ORDER — TESTOSTERONE CYPIONATE 200 MG/ML IM SOLN
200.0000 mg | INTRAMUSCULAR | 0 refills | Status: DC
Start: 2018-03-21 — End: 2018-09-23

## 2018-03-23 ENCOUNTER — Other Ambulatory Visit: Payer: 59

## 2018-03-23 MED FILL — TESTOSTERONE CYP 200 MG/ML: 200 | 84 days supply | Qty: 6 | Fill #0

## 2018-03-26 ENCOUNTER — Other Ambulatory Visit: Payer: 59

## 2018-03-26 DIAGNOSIS — E291 Testicular hypofunction: Secondary | ICD-10-CM | POA: Diagnosis not present

## 2018-03-27 LAB — HEMATOCRIT: Hematocrit: 47.4 % (ref 37.5–51.0)

## 2018-03-27 LAB — TESTOSTERONE: TESTOSTERONE: 384 ng/dL (ref 264–916)

## 2018-03-27 LAB — PSA: PROSTATE SPECIFIC AG, SERUM: 1.5 ng/mL (ref 0.0–4.0)

## 2018-03-30 ENCOUNTER — Telehealth: Payer: Self-pay | Admitting: Family Medicine

## 2018-03-30 NOTE — Telephone Encounter (Signed)
LMOM for patient to return call.

## 2018-03-30 NOTE — Telephone Encounter (Signed)
-----   Message from [name], MD sent at 03/28/2018  1:58 PM EDT ----- PSA was 1.5; T level 384; hct nml Keep oct f/u appt

## 2018-04-01 NOTE — Telephone Encounter (Signed)
Pt informed

## 2018-05-13 MED FILL — BUTALB-ACETAMIN-CAFF 50-325: 50-325-40 | 45 days supply | Qty: 45 | Fill #1

## 2018-06-29 ENCOUNTER — Encounter: Payer: Self-pay | Admitting: Urology

## 2018-06-29 ENCOUNTER — Ambulatory Visit: Payer: 59 | Admitting: Urology

## 2018-06-29 VITALS — BP 130/83 | HR 76 | Ht 67.0 in | Wt 195.0 lb

## 2018-06-29 DIAGNOSIS — E291 Testicular hypofunction: Secondary | ICD-10-CM | POA: Diagnosis not present

## 2018-06-29 MED FILL — ROSUVASTATIN CALCIUM 40 MG: 40 | 90 days supply | Qty: 90 | Fill #2

## 2018-06-29 MED FILL — PANTOPRAZOLE SOD DR 40 MG T: 40 | 90 days supply | Qty: 90 | Fill #2

## 2018-06-29 MED FILL — FENOFIBRATE 160 MG TABLET: 160 | 90 days supply | Qty: 90 | Fill #2

## 2018-06-29 MED FILL — TESTOSTERONE CYP 200 MG/ML: 200 | 55 days supply | Qty: 4 | Fill #1

## 2018-06-29 MED FILL — ATENOLOL 50 MG TABLET: 50 | 90 days supply | Qty: 90 | Fill #2

## 2018-06-30 LAB — HEMATOCRIT: HEMATOCRIT: 45.4 % (ref 37.5–51.0)

## 2018-06-30 LAB — PSA: Prostate Specific Ag, Serum: 1.2 ng/mL (ref 0.0–4.0)

## 2018-06-30 LAB — TESTOSTERONE: TESTOSTERONE: 500 ng/dL (ref 264–916)

## 2018-06-30 NOTE — Progress Notes (Signed)
06/29/2018 1:47 PM   Jackson Vaughan [birth year] [id number]  Referring provider: Shon Baton, MD [address] [place], [place] [address]  Chief Complaint  Patient presents with  . Follow-up    HPI: 57 year old male presents for follow-up of hypogonadism.  He remains on IM testosterone 200 mg every 2 weeks.  Lab work July 2019 remarkable for a testosterone level at 384 ng/dL, PSA 1.5 and hematocrit 47.4.  He has good libido.  He notes some increased irritability on occasions.  His wife states he will have difficulty maintaining an erection until orgasm however he is able to maintain with manual stimulation.  He denies breast tenderness/enlargement.  He denies bothersome lower urinary tract symptoms.   PMH: Past Medical History:  Diagnosis Date  . GERD (gastroesophageal reflux disease)   . Seasonal allergies     Surgical History: Past Surgical History:  Procedure Laterality Date  . HERNIA REPAIR    . RHINOPLASTY      Home Medications:  Allergies as of 06/29/2018   No Known Allergies     Medication List        Accurate as of 06/29/18 11:59 PM. Always use your most recent med list.          ATENOLOL PO Take by mouth.   butalbital-acetaminophen-caffeine 50-325-40 MG tablet Commonly known as:  FIORICET, ESGIC   CRESTOR PO Take by mouth.   fenofibrate 160 MG tablet Take 160 mg by mouth daily.   FISH OIL PO Take by mouth.   pantoprazole 40 MG tablet Commonly known as:  PROTONIX Take 40 mg by mouth daily.   testosterone cypionate 200 MG/ML injection Commonly known as:  DEPOTESTOSTERONE CYPIONATE Inject 1 mL (200 mg total) into the muscle every 14 (fourteen) days.   vitamin C 500 MG tablet Commonly known as:  ASCORBIC ACID Take 500 mg daily by mouth.   vitamin E 400 UNIT capsule Take 400 Units daily by mouth.       Allergies: No Known Allergies  Family History: Family History  Problem Relation Age of Onset  . Kidney cancer Neg Hx   . Kidney  disease Neg Hx   . Prostate cancer Neg Hx     Social History:  reports that he has never smoked. He has never used smokeless tobacco. He reports that he does not drink alcohol or use drugs.  ROS: UROLOGY Frequent Urination?: No Hard to postpone urination?: No Burning/pain with urination?: No Get up at night to urinate?: No Leakage of urine?: No Urine stream starts and stops?: No Trouble starting stream?: No Do you have to strain to urinate?: No Blood in urine?: No Urinary tract infection?: No Sexually transmitted disease?: No Injury to kidneys or bladder?: No Painful intercourse?: No Weak stream?: No Erection problems?: No Penile pain?: No  Gastrointestinal Nausea?: No Vomiting?: No Indigestion/heartburn?: No Diarrhea?: No Constipation?: No  Constitutional Fever: No Night sweats?: No Weight loss?: No Fatigue?: No  Skin Skin rash/lesions?: No Itching?: No  Eyes Blurred vision?: No Double vision?: No  Ears/Nose/Throat Sore throat?: No Sinus problems?: No  Hematologic/Lymphatic Swollen glands?: No Easy bruising?: No  Cardiovascular Leg swelling?: No Chest pain?: No  Respiratory Cough?: No Shortness of breath?: No  Endocrine Excessive thirst?: No  Musculoskeletal Back pain?: No Joint pain?: No  Neurological Headaches?: No Dizziness?: No  Psychologic Depression?: No Anxiety?: No  Physical Exam: BP 130/83 (BP Location: Left Arm, Patient Position: Sitting, Cuff Size: Large)   Pulse 76   Ht 5\' 7"  (  1.702 m)   Wt 195 lb (88.5 kg)   BMI 30.54 kg/m   Constitutional:  Alert and oriented, No acute distress. HEENT: [place] AT, moist mucus membranes.  Trachea midline, no masses. Cardiovascular: No clubbing, cyanosis, or edema. Respiratory: Normal respiratory effort, no increased work of breathing. GI: Abdomen is soft, nontender, nondistended, no abdominal masses GU: No CVA tenderness.  Prostate 45 g, smooth without nodules. Lymph: No cervical or  inguinal lymphadenopathy. Skin: No rashes, bruises or suspicious lesions. Neurologic: Grossly intact, no focal deficits, moving all 4 extremities. Psychiatric: Normal mood and affect.   Assessment & Plan:   57 year old male doing well on TRT.  His last injection was approximately 1 week ago.  Blood was drawn for a testosterone, PSA and hematocrit.  If stable he will follow-up in 6 months.    [name], [place] [address] [place], [place] [address] [phone]

## 2018-07-01 ENCOUNTER — Telehealth: Payer: Self-pay

## 2018-07-01 NOTE — Telephone Encounter (Signed)
Left detailed message.   

## 2018-07-01 NOTE — Telephone Encounter (Signed)
-----   Message from [name], MD sent at 07/01/2018  8:13 AM EDT ----- PSA stable at 1.2.  Testosterone level looks good at 500.  Hematocrit was normal.

## 2018-07-03 ENCOUNTER — Other Ambulatory Visit: Payer: Self-pay | Admitting: Urology

## 2018-07-03 MED ORDER — SILDENAFIL CITRATE 20 MG PO TABS
ORAL_TABLET | ORAL | 0 refills | Status: DC
Start: 2018-07-03 — End: 2019-02-18

## 2018-07-03 MED FILL — SILDENAFIL CITRATE 20 MG TA: 20 | 6 days supply | Qty: 30 | Fill #0

## 2018-07-03 NOTE — Progress Notes (Signed)
Requests trial sildenafil for ED

## 2018-07-06 MED FILL — FLUTICASONE PROP 50 MCG SPR: 50 | 90 days supply | Qty: 48 | Fill #0

## 2018-09-15 DIAGNOSIS — Z0000 Encounter for general adult medical examination without abnormal findings: Secondary | ICD-10-CM | POA: Diagnosis not present

## 2018-09-15 DIAGNOSIS — R5383 Other fatigue: Secondary | ICD-10-CM | POA: Diagnosis not present

## 2018-09-15 DIAGNOSIS — R7309 Other abnormal glucose: Secondary | ICD-10-CM | POA: Diagnosis not present

## 2018-09-15 DIAGNOSIS — Z125 Encounter for screening for malignant neoplasm of prostate: Secondary | ICD-10-CM | POA: Diagnosis not present

## 2018-09-15 DIAGNOSIS — I10 Essential (primary) hypertension: Secondary | ICD-10-CM | POA: Diagnosis not present

## 2018-09-22 ENCOUNTER — Telehealth: Payer: Self-pay

## 2018-09-22 NOTE — Telephone Encounter (Signed)
Requesting refill on Testosterone.

## 2018-09-23 MED ORDER — TESTOSTERONE CYPIONATE 200 MG/ML IM SOLN: 200.0000 mg | INTRAMUSCULAR | 0 refills | Status: DC

## 2018-09-23 MED FILL — TESTOSTERONE CYP 200 MG/ML: 200 | 84 days supply | Qty: 6 | Fill #0

## 2018-09-23 NOTE — Telephone Encounter (Signed)
Rx sent 

## 2018-09-24 DIAGNOSIS — Z1389 Encounter for screening for other disorder: Secondary | ICD-10-CM | POA: Diagnosis not present

## 2018-09-24 DIAGNOSIS — I10 Essential (primary) hypertension: Secondary | ICD-10-CM | POA: Diagnosis not present

## 2018-09-24 DIAGNOSIS — R945 Abnormal results of liver function studies: Secondary | ICD-10-CM | POA: Diagnosis not present

## 2018-09-24 DIAGNOSIS — E291 Testicular hypofunction: Secondary | ICD-10-CM | POA: Diagnosis not present

## 2018-09-24 DIAGNOSIS — Z0000 Encounter for general adult medical examination without abnormal findings: Secondary | ICD-10-CM | POA: Diagnosis not present

## 2018-09-24 DIAGNOSIS — F5221 Male erectile disorder: Secondary | ICD-10-CM | POA: Diagnosis not present

## 2018-09-24 DIAGNOSIS — R5383 Other fatigue: Secondary | ICD-10-CM | POA: Diagnosis not present

## 2018-09-24 DIAGNOSIS — R6882 Decreased libido: Secondary | ICD-10-CM | POA: Diagnosis not present

## 2018-09-24 DIAGNOSIS — R7309 Other abnormal glucose: Secondary | ICD-10-CM | POA: Diagnosis not present

## 2018-09-24 DIAGNOSIS — E7849 Other hyperlipidemia: Secondary | ICD-10-CM | POA: Diagnosis not present

## 2018-09-25 MED FILL — BUTALB-ACETAMIN-CAFF 50-325: 50-325-40 | 45 days supply | Qty: 45 | Fill #2

## 2018-09-25 MED FILL — ROSUVASTATIN CALCIUM 40 MG: 40 | 90 days supply | Qty: 90 | Fill #0

## 2018-09-25 MED FILL — FENOFIBRATE 160 MG TABLET: 160 | 90 days supply | Qty: 90 | Fill #3

## 2018-09-25 MED FILL — ATENOLOL 50 MG TABLET: 50 | 90 days supply | Qty: 90 | Fill #0

## 2018-09-25 MED FILL — PANTOPRAZOLE SOD DR 40 MG T: 40 | 90 days supply | Qty: 90 | Fill #0

## 2018-09-30 ENCOUNTER — Other Ambulatory Visit: Payer: Self-pay | Admitting: Urology

## 2018-09-30 DIAGNOSIS — R972 Elevated prostate specific antigen [PSA]: Secondary | ICD-10-CM

## 2018-10-16 ENCOUNTER — Other Ambulatory Visit: Payer: Self-pay

## 2018-10-16 DIAGNOSIS — E291 Testicular hypofunction: Secondary | ICD-10-CM

## 2018-10-16 DIAGNOSIS — E349 Endocrine disorder, unspecified: Secondary | ICD-10-CM

## 2018-10-16 DIAGNOSIS — R972 Elevated prostate specific antigen [PSA]: Secondary | ICD-10-CM

## 2018-10-19 ENCOUNTER — Other Ambulatory Visit: Payer: 59

## 2018-10-19 DIAGNOSIS — E349 Endocrine disorder, unspecified: Secondary | ICD-10-CM | POA: Diagnosis not present

## 2018-10-19 DIAGNOSIS — E291 Testicular hypofunction: Secondary | ICD-10-CM

## 2018-10-19 DIAGNOSIS — R972 Elevated prostate specific antigen [PSA]: Secondary | ICD-10-CM

## 2018-10-20 LAB — HEMATOCRIT: Hematocrit: 48.7 % (ref 37.5–51.0)

## 2018-10-20 LAB — TESTOSTERONE: Testosterone: 179 ng/dL — ABNORMAL LOW (ref 264–916)

## 2018-10-21 ENCOUNTER — Telehealth: Payer: Self-pay

## 2018-10-21 ENCOUNTER — Encounter: Payer: Self-pay | Admitting: Urology

## 2018-10-21 NOTE — Telephone Encounter (Signed)
-----   Message from [name], MD sent at 10/20/2018  9:18 PM EST ----- Recent psa was elevated- is he holding testosterone injections?  If not when was last injection? Please add PSA to this lab draw

## 2018-10-21 NOTE — Telephone Encounter (Signed)
Spoke with Shelah Lewandowsky w/[facility] and PSA was added on.

## 2018-10-22 ENCOUNTER — Telehealth: Payer: Self-pay

## 2018-10-22 NOTE — Telephone Encounter (Signed)
-----   Message from [name], MD sent at 10/22/2018  2:46 PM EST ----- Repeat PSA was back to baseline at 1.1

## 2018-10-23 LAB — SPECIMEN STATUS REPORT

## 2018-10-23 LAB — PSA: PROSTATE SPECIFIC AG, SERUM: 1.1 ng/mL (ref 0.0–4.0)

## 2018-10-27 ENCOUNTER — Encounter: Payer: Self-pay | Admitting: Physician Assistant

## 2018-10-27 ENCOUNTER — Ambulatory Visit (INDEPENDENT_AMBULATORY_CARE_PROVIDER_SITE_OTHER): Payer: Self-pay | Admitting: Physician Assistant

## 2018-10-27 VITALS — BP 130/92 | HR 79 | Temp 97.7°F | Resp 16 | Ht 67.0 in | Wt 202.0 lb

## 2018-10-27 DIAGNOSIS — J069 Acute upper respiratory infection, unspecified: Secondary | ICD-10-CM

## 2018-10-27 MED ORDER — PSEUDOEPH-BROMPHEN-DM 30-2-10 MG/5ML PO SYRP: 5.0000 mL | ORAL_SOLUTION | Freq: Four times a day (QID) | ORAL | 0 refills | Status: DC | PRN

## 2018-10-27 NOTE — Patient Instructions (Signed)
Thank you for choosing [facility] for your health care needs.  Suspect you have the flu (influenza virus).  Recommend increase fluids; water, Gatorade, or hot tea with lemon/honey Rest. Take over the counter Tylenol or ibuprofen for pain/fever.  You are considered most contagious one week from onset of symptoms; wash hands, use bacterial hand sanitizer, cough in to elbow.  Use Flonase nasal spray. Take prescription cough syrup as prescribed.  Follow-up with family physician or urgent care in 3 days if symptoms do not continue to improve.  Hope you feel better soon!  Upper Respiratory Infection, Adult An upper respiratory infection (URI) affects the nose, throat, and upper air passages. URIs are caused by germs (viruses). The most common type of URI is often called "the common cold." Medicines cannot cure URIs, but you can do things at home to relieve your symptoms. URIs usually get better within 7-10 days. Follow these instructions at home: Activity  Rest as needed.  If you have a fever, stay home from work or school until your fever is gone, or until your doctor says you may return to work or school. ? You should stay home until you cannot spread the infection anymore (you are not contagious). ? Your doctor may have you wear a face mask so you have less risk of spreading the infection. Relieving symptoms  Gargle with a salt-water mixture 3-4 times a day or as needed. To make a salt-water mixture, completely dissolve -1 tsp of salt in 1 cup of warm water.  Use a cool-mist humidifier to add moisture to the air. This can help you breathe more easily. Eating and drinking   Drink enough fluid to keep your pee (urine) pale yellow.  Eat soups and other clear broths. General instructions   Take over-the-counter and prescription medicines only as told by your doctor. These include cold medicines, fever reducers, and cough suppressants.  Do not use any products that contain  nicotine or tobacco. These include cigarettes and e-cigarettes. If you need help quitting, ask your doctor.  Avoid being where people are smoking (avoid secondhand smoke).  Make sure you get regular shots and get the flu shot every year.  Keep all follow-up visits as told by your doctor. This is important. How to avoid spreading infection to others   Wash your hands often with soap and water. If you do not have soap and water, use hand sanitizer.  Avoid touching your mouth, face, eyes, or nose.  Cough or sneeze into a tissue or your sleeve or elbow. Do not cough or sneeze into your hand or into the air. Contact a doctor if:  You are getting worse, not better.  You have any of these: ? A fever. ? Chills. ? Brown or red mucus in your nose. ? Yellow or brown fluid (discharge)coming from your nose. ? Pain in your face, especially when you bend forward. ? Swollen neck glands. ? Pain with swallowing. ? White areas in the back of your throat. Get help right away if:  You have shortness of breath that gets worse.  You have very bad or constant: ? Headache. ? Ear pain. ? Pain in your forehead, behind your eyes, and over your cheekbones (sinus pain). ? Chest pain.  You have long-lasting (chronic) lung disease along with any of these: ? Wheezing. ? Long-lasting cough. ? Coughing up blood. ? A change in your usual mucus.  You have a stiff neck.  You have changes in your: ? Vision. ? Hearing. ?  Thinking. ? Mood. Summary  An upper respiratory infection (URI) is caused by a germ called a virus. The most common type of URI is often called "the common cold."  URIs usually get better within 7-10 days.  Take over-the-counter and prescription medicines only as told by your doctor. This information is not intended to replace advice given to you by your health care provider. Make sure you discuss any questions you have with your health care provider. Document Released: 02/12/2008  Document Revised: 04/18/2017 Document Reviewed: 04/18/2017 Elsevier Interactive Patient Education  2019 [employer].

## 2018-10-27 NOTE — Progress Notes (Signed)
Patient ID: TATUM MASSMAN DOB: 07-26-1961 AGE: 58 y.o. MRN: [id number]   PCP: Shon Baton, MD   Chief Complaint:  Chief Complaint  Patient presents with  . Generalized Body Aches    x3d  . Cough    x3d  . Chills    x3d     Subjective:    HPI:  Jackson Vaughan is a 58 y.o. male presents for evaluation  Chief Complaint  Patient presents with  . Generalized Body Aches    x3d  . Cough    x3d  . Chills    x68d    58 year old male presents to [facility] with four day history of flu-like symptoms. Began with mild, dry, annoying cough. By that evening, developed chills. Suspects had a fever, did not take temperature with thermometer. Following day developed nasal congestion, frontal headache, and body aches. Associated fatigue and malaise. Yesterday was worst day of symptoms; slept majority of the day. Took OTC Nyquil yesterday evening. Today feeling slightly better. Chills have resolved. Denies dizziness/lightheadedness, ear pain, maxillary sinus pain, sore throat, neck pain, chest pain, SOB, wheezing, nausea/vomiting, diarrhea, abdominal pain.  Patient did receive this season's influenza vaccination. No known close contact with influenza. Patient is a former cigarette smoker; quit 10 years ago. No diagnosis of asthma, COPD or emphysema. Patient does have seasonal allergies; has Flonase, has not been using with current symptoms.   A limited review of symptoms was performed, pertinent positives and negatives as mentioned in HPI.  The following portions of the patient's history were reviewed and updated as appropriate: allergies, current medications and past medical history.  Patient Active Problem List   Diagnosis Date Noted  . Hypogonadism in male 12/29/2017    No Known Allergies  Current Outpatient Medications on File Prior to Visit  Medication Sig Dispense Refill  . ATENOLOL PO Take by mouth.    . butalbital-acetaminophen-caffeine (FIORICET, ESGIC) 50-325-40  MG tablet     . fenofibrate 160 MG tablet Take 160 mg by mouth daily.  3  . Omega-3 Fatty Acids (FISH OIL PO) Take by mouth.    . pantoprazole (PROTONIX) 40 MG tablet Take 40 mg by mouth daily.  2  . Rosuvastatin Calcium (CRESTOR PO) Take by mouth.    . sildenafil (REVATIO) 20 MG tablet 2-5 tabs 1 hour prior to intercourse 30 tablet 0  . testosterone cypionate (DEPOTESTOSTERONE CYPIONATE) 200 MG/ML injection Inject 1 mL (200 mg total) into the muscle every 14 (fourteen) days. 10 mL 0  . vitamin C (ASCORBIC ACID) 500 MG tablet Take 500 mg daily by mouth.    . vitamin E 400 UNIT capsule Take 400 Units daily by mouth.    . fluticasone (FLONASE) 50 MCG/ACT nasal spray      No current facility-administered medications on file prior to visit.        Objective:   Vitals:   10/27/18 1032  BP: (!) 130/92  Pulse: 79  Resp: 16  Temp: 97.7 F (36.5 C)  SpO2: 98%     Wt Readings from Last 3 Encounters:  10/27/18 202 lb (91.6 kg)  06/29/18 195 lb (88.5 kg)  12/29/17 208 lb 12.8 oz (94.7 kg)    Physical Exam:   General Appearance:  Patient sitting comfortably on examination table. Conversational. Kermit Balo self-historian. In no acute distress. Afebrile.   Head:  Normocephalic, without obvious abnormality, atraumatic  Eyes:  PERRL, conjunctiva/corneas clear, EOM's intact  Ears:  Bilateral ear canals WNL. No  erythema or edema. No discharge/drainage. Bilateral TMs WNL. No erythema, injection, or serous effusion. No scar tissue.  Nose: Nares normal, septum midline. Nasal mucosa with minimal edema, clear rhinorrhea. No sinus tenderness with percussion/palpation. Subjective frontal sinus pressure/congestion.  Throat: Lips, mucosa, and tongue normal; teeth and gums normal. Throat reveals no erythema. Tonsils with no enlargement or exudate.  Neck: Supple, symmetrical, trachea midline, no adenopathy  Lungs:   Clear to auscultation bilaterally, respirations unlabored. Good aeration. No rales, rhonchi,  crackles or wheezing.  Heart:  Regular rate and rhythm, S1 and S2 normal, no murmur, rub, or gallop  Extremities: Extremities normal, atraumatic, no cyanosis or edema  Pulses: 2+ and symmetric  Skin: Skin color, texture, turgor normal, no rashes or lesions  Lymph nodes: Cervical, supraclavicular, and axillary nodes normal  Neurologic: Normal    Assessment & Plan:    Exam findings, diagnosis etiology and medication use and indications reviewed with patient. Follow-Up and discharge instructions provided. No emergent/urgent issues found on exam.  Patient education was provided.   Patient verbalized understanding of information provided and agrees with plan of care (POC), all questions answered. The patient is advised to call or return to clinic if condition does not see an improvement in symptoms, or to seek the care of the closest emergency department if condition worsens with the below plan.    1. Upper respiratory tract infection, unspecified type - brompheniramine-pseudoephedrine-DM 30-2-10 MG/5ML syrup; Take 5 mLs by mouth 4 (four) times daily as needed.  Dispense: 120 mL; Refill: 0  Patient with four day history of flu/URI symptoms. Based on first day of symptoms (fever, chills, and body aches), suspect patient had influenza. Now has lingering nasal congestion and cough. VSS, afebrile, in no acute distress, clear lung sounds. Prescribed Bromfed for symptom relief. Offered rapid flu testing, though discouraged; stated patient was out of antiviral range (>48 hours of symptoms). Patient improving, suspect self limited viral URI. Discussed possible complications of influenza; advised f/u with PCP or urgent care in a few days if symptoms do not continue to improve, sooner with any worsening symptoms. Discussed contagiousness and recommended precautions. Patient understood. Agreed with plan.   Darlin Priestly, MHS, PA-C Montey Hora, MHS, PA-C Advanced Practice Provider [facility]  [address], [facility], [place], [place] [address] (p):  [phone] [email or web address]

## 2018-10-29 ENCOUNTER — Telehealth: Payer: Self-pay | Admitting: Emergency Medicine

## 2018-10-29 NOTE — Telephone Encounter (Signed)
Left message follow up call from visit with [facility]. 

## 2018-11-23 ENCOUNTER — Encounter: Payer: Self-pay | Admitting: Urology

## 2018-12-03 ENCOUNTER — Encounter: Payer: Self-pay | Admitting: Urology

## 2018-12-15 ENCOUNTER — Telehealth: Payer: Self-pay | Admitting: Urology

## 2018-12-15 MED FILL — PANTOPRAZOLE SOD DR 40 MG T: 40 | 90 days supply | Qty: 90 | Fill #0

## 2018-12-15 MED FILL — FLUTICASONE PROP 50 MCG SPR: 50 | 90 days supply | Qty: 48 | Fill #0

## 2018-12-15 MED FILL — FENOFIBRATE 160 MG TABLET: 160 | 90 days supply | Qty: 90 | Fill #0

## 2018-12-15 MED FILL — ATENOLOL 50 MG TABLET: 50 | 90 days supply | Qty: 90 | Fill #0

## 2018-12-15 MED FILL — ROSUVASTATIN CALCIUM 40 MG: 40 | 90 days supply | Qty: 90 | Fill #0

## 2018-12-15 NOTE — Telephone Encounter (Signed)
Jackson Vaughan from [employer] called and wanted to let you know that " charges were retracted from insurance and bill was revised."

## 2018-12-24 ENCOUNTER — Other Ambulatory Visit: Payer: 59

## 2018-12-29 ENCOUNTER — Ambulatory Visit: Payer: 59 | Admitting: Urology

## 2019-01-14 MED FILL — TESTOSTERONE CYPIONATE 200: 200 | 56 days supply | Qty: 4 | Fill #0

## 2019-01-14 MED FILL — BUTALB-ACETAMIN-CAFF 50-325: 50-325-40 | 45 days supply | Qty: 45 | Fill #0

## 2019-02-12 ENCOUNTER — Other Ambulatory Visit: Payer: Self-pay

## 2019-02-12 ENCOUNTER — Other Ambulatory Visit: Payer: 59

## 2019-02-12 DIAGNOSIS — E291 Testicular hypofunction: Secondary | ICD-10-CM | POA: Diagnosis not present

## 2019-02-13 LAB — HEMATOCRIT: Hematocrit: 45.9 % (ref 37.5–51.0)

## 2019-02-13 LAB — TESTOSTERONE: Testosterone: 266 ng/dL (ref 264–916)

## 2019-02-13 LAB — PSA: Prostate Specific Ag, Serum: 1.5 ng/mL (ref 0.0–4.0)

## 2019-02-16 ENCOUNTER — Other Ambulatory Visit: Payer: Self-pay

## 2019-02-16 ENCOUNTER — Ambulatory Visit (INDEPENDENT_AMBULATORY_CARE_PROVIDER_SITE_OTHER): Payer: 59 | Admitting: Urology

## 2019-02-16 ENCOUNTER — Encounter: Payer: Self-pay | Admitting: Urology

## 2019-02-16 VITALS — BP 142/78 | HR 78 | Ht 67.0 in | Wt 206.6 lb

## 2019-02-16 DIAGNOSIS — E291 Testicular hypofunction: Secondary | ICD-10-CM

## 2019-02-16 DIAGNOSIS — N5201 Erectile dysfunction due to arterial insufficiency: Secondary | ICD-10-CM

## 2019-02-16 NOTE — Progress Notes (Signed)
02/16/2019 3:40 PM   [name] [birth year] [id number]  Referring provider: Shon Baton, MD [address] [place], [place] [address]  Chief Complaint  Patient presents with  . Hypogonadism    HPI: 58 year old male presents for follow-up of hypogonadism.  His initial symptoms were significant decreased libido, tiredness and fatigue.  His wife is a Marine scientist and is injecting 200 mg every 2 weeks.  He does feel his libido has improved however he states his wife thinks his dose needs to be increased.  He denies breast tenderness/enlargement, edema or bothersome lower urinary tract symptoms.  He had lab work drawn on 02/12/2019 however he does not remember where he was in his injection cycle.  He thinks it was at least 1 week however may be closer to 2 weeks.  Testosterone level was 266, PSA 1.5 and hematocrit 45.9.  At his last visit he was also given a trial of generic sildenafil for ED however states he had flushing and lightheadedness and could not tolerate the side effects.   PMH: Past Medical History:  Diagnosis Date  . GERD (gastroesophageal reflux disease)   . Seasonal allergies     Surgical History: Past Surgical History:  Procedure Laterality Date  . HERNIA REPAIR    . RHINOPLASTY      Home Medications:  Allergies as of 02/16/2019   No Known Allergies     Medication List       Accurate as of February 16, 2019  3:40 PM. If you have any questions, ask your nurse or doctor.        STOP taking these medications   brompheniramine-pseudoephedrine-DM 30-2-10 MG/5ML syrup Stopped by:  [name], MD     TAKE these medications   ATENOLOL PO Take by mouth.   butalbital-acetaminophen-caffeine 50-325-40 MG tablet Commonly known as:  FIORICET   CRESTOR PO Take by mouth.   fenofibrate 160 MG tablet Take 160 mg by mouth daily.   FISH OIL PO Take by mouth.   fluticasone 50 MCG/ACT nasal spray Commonly known as:  FLONASE   pantoprazole 40 MG tablet Commonly  known as:  PROTONIX Take 40 mg by mouth daily.   sildenafil 20 MG tablet Commonly known as:  REVATIO 2-5 tabs 1 hour prior to intercourse   testosterone cypionate 200 MG/ML injection Commonly known as:  DEPOTESTOSTERONE CYPIONATE Inject 1 mL (200 mg total) into the muscle every 14 (fourteen) days.   vitamin C 500 MG tablet Commonly known as:  ASCORBIC ACID Take 500 mg daily by mouth.   vitamin E 400 UNIT capsule Take 400 Units daily by mouth.       Allergies: No Known Allergies  Family History: Family History  Problem Relation Age of Onset  . Kidney cancer Neg Hx   . Kidney disease Neg Hx   . Prostate cancer Neg Hx     Social History:  reports that he has never smoked. He has never used smokeless tobacco. He reports that he does not drink alcohol or use drugs.  ROS: UROLOGY Frequent Urination?: No Hard to postpone urination?: No Burning/pain with urination?: No Get up at night to urinate?: No Leakage of urine?: No Urine stream starts and stops?: No Trouble starting stream?: No Do you have to strain to urinate?: No Blood in urine?: No Urinary tract infection?: No Sexually transmitted disease?: No Injury to kidneys or bladder?: No Painful intercourse?: No Weak stream?: No Erection problems?: No Penile pain?: No  Gastrointestinal Nausea?: No Vomiting?: No  Indigestion/heartburn?: No Diarrhea?: No Constipation?: No  Constitutional Fever: No Night sweats?: No Weight loss?: No Fatigue?: No  Skin Skin rash/lesions?: No Itching?: No  Eyes Blurred vision?: No Double vision?: No  Ears/Nose/Throat Sore throat?: No Sinus problems?: No  Hematologic/Lymphatic Swollen glands?: No Easy bruising?: No  Cardiovascular Leg swelling?: No Chest pain?: No  Respiratory Cough?: No Shortness of breath?: No  Endocrine Excessive thirst?: No  Musculoskeletal Back pain?: No Joint pain?: No  Neurological Headaches?: No Dizziness?: No  Psychologic  Depression?: No Anxiety?: No  Physical Exam: BP (!) 142/78 (BP Location: Left Arm, Patient Position: Sitting, Cuff Size: Normal)   Pulse 78   Ht 5\' 7"  (1.702 m)   Wt 206 lb 9.6 oz (93.7 kg)   BMI 32.36 kg/m   Constitutional:  Alert and oriented, No acute distress. HEENT: [place] AT, moist mucus membranes.  Trachea midline, no masses. Cardiovascular: No clubbing, cyanosis, or edema. Respiratory: Normal respiratory effort, no increased work of breathing. GI: Abdomen is soft, nontender, nondistended, no abdominal masses GU: No CVA tenderness.  Prostate 45 g, smooth without nodules Lymph: No cervical or inguinal lymphadenopathy. Skin: No rashes, bruises or suspicious lesions. Neurologic: Grossly intact, no focal deficits, moving all 4 extremities. Psychiatric: Normal mood and affect.   Assessment & Plan:   58 year old male with a ED and hypogonadism.  He is currently satisfied with his symptoms.  His testosterone could be increased based on his most recent levels.  I offered him a trial of tadalafil which may have less side effects and he was interested in pursuing.  Rx was sent to his pharmacy.  Follow-up 6 months for a lab visit and office visit 1 year.  [name], [place] [address], [place], [place] [address] [phone]

## 2019-02-16 NOTE — Patient Instructions (Signed)
Testosterone Replacement Therapy  Testosterone replacement therapy (TRT) is used to treat men who have a low testosterone level (hypogonadism). Testosterone is a male hormone that is produced in the testicles. It is responsible for typically male characteristics and for maintaining a man's sex drive and the ability to get an erection. Testosterone also supports bone and muscle health. TRT can be a gel, liquid, or patch that you put on your skin. It can also be in the form of a tablet or an injection. In some cases, your health care provider may insert long-acting pellets under your skin. In most men, the level of testosterone starts to decline gradually after age 58. Low testosterone can also be caused by certain medical conditions, medicines, and obesity. Your health care provider can diagnose hypogonadism with at least two blood tests that are done early in the morning. Low testosterone may not need to be treated. TRT is usually a choice that you make with your health care provider. Your health care provider may recommend TRT if you have low testosterone that is causing symptoms, such as:  Low sex drive.  Erection problems.  Breast enlargement.  Loss of body hair.  Weak muscles or bones.  Shrinking testicles.  Increased body fat.  Low energy.  Hot flashes.  Depression.  Decreased work Systems analyst. TRT is a lifetime treatment. If you stop treatment, your testosterone will drop, and your symptoms may return. What are the risks? Testosterone replacement therapy may have side effects, including:  Lower sperm count.  Skin irritation at the application or injection site.  Mouth irritation if you take an oral tablet.  Acne.  Swelling of your legs or feet.  Tender breasts.  Dizziness.  Sleep disturbance.  Mood swings.  Possible increased risk of stroke or heart attack. Testosterone replacement therapy may also increase your risk for prostate cancer or male breast cancer.  You should not use TRT if you have either of those conditions. Your health care provider also may not recommend TRT if:  You are suspected of having prostate cancer.  You want to father a child.  You have a high number of red blood cells.  You have untreated sleep apnea.  You have a very large prostate. Supplies needed:  Your health care provider will prescribe the testosterone gel, solution, or medicine that you need. If your health care provider teaches you to do self-injections at home, you will also need: ? Your medicine vial. ? Disposable needles and syringes. ? Alcohol swabs. ? A needle disposal container. ? Adhesive bandages. How to use testosterone replacement therapy Your health care provider will help you find the TRT option that will work best for you based on your preference, the side effects, and the cost. You may:  Rub testosterone gel on your upper arm or shoulder every day after a shower. This is the most common type of TRT. Do not let women or children come in contact with the gel.  Apply a testosterone solution under your arms once each day.  Place a testosterone patch on your skin once each day.  Dissolve a testosterone tablet in your mouth twice each day.  Have a testosterone pellet inserted under your skin by your health care provider. This will be replaced every 3-6 months.  Use testosterone nasal spray three times each day.  Get testosterone injections. For some types of testosterone, your health care provider will give you this injection. With other types of testosterone, you may be taught to give injections to  provider. This will be replaced every 3-6 months.  · Use testosterone nasal spray three times each day.  · Get testosterone injections. For some types of testosterone, your health care provider will give you this injection. With other types of testosterone, you may be taught to give injections to yourself. The frequency of injections may vary based on the type of testosterone that you receive.  Follow these instructions at home:  · Take over-the-counter and prescription medicines only as told by your health care provider.  · Lose weight if you are overweight. Ask your health care provider to help you start a healthy diet and exercise program to  reach and maintain a healthy weight.  · Work with your health care provider to treat other medical conditions that may lower your testosterone. These include obesity, high blood pressure, high cholesterol, diabetes, liver disease, kidney disease, and sleep apnea.  · Keep all follow-up visits as told by your health care provider. This is important.  General recommendations  · Discuss all risks and benefits with your health care provider before starting therapy.  · Work with your health care provider to check your prostate health and do blood testing before you start therapy.  · Do not use any testosterone replacement therapies that are not prescribed by your health care provider or not approved for use in the [place]  · Do not use TRT for bodybuilding or to improve sexual performance. TRT should be used only to treat symptoms of low testosterone.  · Return for all repeat prostate checks and blood tests during therapy, as told by your health care provider.  Where to find more information  Learn more about testosterone replacement therapy from:  · American U[employer] Foundation: www.urologyhealth.org/urologic-conditions/low-testosterone-(hypogonadism)  · Endocrine Society: www.hormone.org/diseases-and-conditions/mens-health/hypogonadism  Contact a health care provider if:  · You have side effects from your testosterone replacement therapy.  · You continue to have symptoms of low testosterone during treatment.  · You develop new symptoms during treatment.  Summary  · Testosterone replacement therapy is only for men who have low testosterone as determined by blood testing and who have symptoms of low testosterone.  · Testosterone replacement therapy should be prescribed only by a health care provider and should be used under the supervision of a health care provider.  · You may not be able to take testosterone if you have certain medical conditions, including prostate cancer, male breast cancer, or heart  disease.  · Testosterone replacement therapy may have side effects and may make some medical conditions worse.  · Talk with your health care provider about all the risks and benefits before you start therapy.  This information is not intended to replace advice given to you by your health care provider. Make sure you discuss any questions you have with your health care provider.  Document Released: 05/16/2016 Document Revised: 05/16/2016 Document Reviewed: 05/16/2016  Elsevier Interactive Patient Education © 2019 Elsevier Inc.

## 2019-02-17 ENCOUNTER — Other Ambulatory Visit: Payer: Self-pay | Admitting: Family Medicine

## 2019-02-17 NOTE — Telephone Encounter (Signed)
Pt's wife called and states that she is at the [facility] and there was supposed to be a rx for Sildenafil called in for her Husband.She also states that she checked at [facility] and they don't have it. Please advise.

## 2019-02-18 ENCOUNTER — Encounter: Payer: Self-pay | Admitting: Urology

## 2019-02-18 MED ORDER — TADALAFIL 20 MG PO TABS: ORAL_TABLET | ORAL | 1 refills | Status: AC

## 2019-02-18 MED ORDER — SILDENAFIL CITRATE 20 MG PO TABS: ORAL_TABLET | ORAL | 3 refills | Status: DC

## 2019-02-18 NOTE — Addendum Note (Signed)
Addended by: [name] on: 02/18/2019 02:12 PM   Modules accepted: Orders

## 2019-02-18 NOTE — Telephone Encounter (Signed)
Refill sent.

## 2019-03-09 ENCOUNTER — Other Ambulatory Visit: Payer: Self-pay | Admitting: Family Medicine

## 2019-03-09 MED FILL — ATENOLOL 50 MG TABLET: 50 | 90 days supply | Qty: 90 | Fill #1

## 2019-03-09 MED FILL — FLUTICASONE PROP 50 MCG SPR: 50 | 90 days supply | Qty: 48 | Fill #1

## 2019-03-09 MED FILL — FENOFIBRATE 160 MG TABLET: 160 | 90 days supply | Qty: 90 | Fill #1

## 2019-03-09 MED FILL — PANTOPRAZOLE SOD DR 40 MG T: 40 | 90 days supply | Qty: 90 | Fill #1

## 2019-03-09 MED FILL — ROSUVASTATIN CALCIUM 40 MG: 40 | 90 days supply | Qty: 90 | Fill #1

## 2019-03-11 MED ORDER — TESTOSTERONE CYPIONATE 200 MG/ML IM SOLN: 200.0000 mg | INTRAMUSCULAR | 0 refills | Status: DC

## 2019-03-11 MED FILL — TESTOSTERONE CYPIONATE 200: 200 | 84 days supply | Qty: 6 | Fill #0

## 2019-03-30 DIAGNOSIS — E785 Hyperlipidemia, unspecified: Secondary | ICD-10-CM | POA: Diagnosis not present

## 2019-03-30 DIAGNOSIS — R51 Headache: Secondary | ICD-10-CM | POA: Diagnosis not present

## 2019-03-30 DIAGNOSIS — E669 Obesity, unspecified: Secondary | ICD-10-CM | POA: Diagnosis not present

## 2019-03-30 DIAGNOSIS — R5383 Other fatigue: Secondary | ICD-10-CM | POA: Diagnosis not present

## 2019-03-30 DIAGNOSIS — R739 Hyperglycemia, unspecified: Secondary | ICD-10-CM | POA: Diagnosis not present

## 2019-03-30 DIAGNOSIS — E7849 Other hyperlipidemia: Secondary | ICD-10-CM | POA: Diagnosis not present

## 2019-03-30 DIAGNOSIS — I10 Essential (primary) hypertension: Secondary | ICD-10-CM | POA: Diagnosis not present

## 2019-03-30 DIAGNOSIS — E291 Testicular hypofunction: Secondary | ICD-10-CM | POA: Diagnosis not present

## 2019-03-30 DIAGNOSIS — Z7689 Persons encountering health services in other specified circumstances: Secondary | ICD-10-CM | POA: Diagnosis not present

## 2019-05-13 MED FILL — BUTALB-ACETAMIN-CAFF 50-325: 50-325-40 | 45 days supply | Qty: 45 | Fill #0

## 2019-06-01 MED FILL — TESTOSTERONE CYP 200 MG/ML: 200 | 56 days supply | Qty: 4 | Fill #0

## 2019-06-03 MED FILL — ROSUVASTATIN CALCIUM 40 MG: 40 | 90 days supply | Qty: 90 | Fill #0

## 2019-06-03 MED FILL — FENOFIBRATE 160 MG TABLET: 160 | 90 days supply | Qty: 90 | Fill #0

## 2019-06-03 MED FILL — ATENOLOL 50 MG TABLET: 50 | 90 days supply | Qty: 90 | Fill #0

## 2019-06-03 MED FILL — PANTOPRAZOLE SOD DR 40 MG T: 40 | 90 days supply | Qty: 90 | Fill #0

## 2019-07-05 MED FILL — FLUTICASONE PROP 50 MCG SPR: 50 | 90 days supply | Qty: 48 | Fill #0

## 2019-08-20 ENCOUNTER — Other Ambulatory Visit: Payer: 59

## 2019-08-25 ENCOUNTER — Other Ambulatory Visit: Payer: 59

## 2019-08-25 ENCOUNTER — Other Ambulatory Visit: Payer: Self-pay

## 2019-08-25 DIAGNOSIS — E291 Testicular hypofunction: Secondary | ICD-10-CM

## 2019-08-26 ENCOUNTER — Telehealth: Payer: Self-pay

## 2019-08-26 LAB — HEMATOCRIT: Hematocrit: 50.4 % (ref 37.5–51.0)

## 2019-08-26 LAB — TESTOSTERONE: Testosterone: 627 ng/dL (ref 264–916)

## 2019-08-26 LAB — PSA: Prostate Specific Ag, Serum: 1.3 ng/mL (ref 0.0–4.0)

## 2019-08-26 NOTE — Telephone Encounter (Signed)
See my chart message

## 2019-08-26 NOTE — Telephone Encounter (Signed)
-----   Message from [name], MD sent at 08/26/2019  7:40 AM EST ----- PSA stable at 1.3.  Testosterone looks good at 627 and hematocrit was normal.  Follow-up in June as scheduled

## 2019-09-07 MED FILL — FENOFIBRATE 160 MG TABLET: 160 | 90 days supply | Qty: 90 | Fill #1

## 2019-09-07 MED FILL — ATENOLOL 50 MG TABLET: 50 | 90 days supply | Qty: 90 | Fill #1

## 2019-09-07 MED FILL — PANTOPRAZOLE SOD DR 40 MG T: 40 | 90 days supply | Qty: 90 | Fill #1

## 2019-09-07 MED FILL — ROSUVASTATIN CALCIUM 40 MG: 40 | 90 days supply | Qty: 90 | Fill #1

## 2019-09-07 MED FILL — TESTOSTERONE CYP 200 MG/ML: 200 | 56 days supply | Qty: 4 | Fill #1

## 2019-09-20 DIAGNOSIS — Z125 Encounter for screening for malignant neoplasm of prostate: Secondary | ICD-10-CM | POA: Diagnosis not present

## 2019-09-20 DIAGNOSIS — R739 Hyperglycemia, unspecified: Secondary | ICD-10-CM | POA: Diagnosis not present

## 2019-09-20 DIAGNOSIS — E7849 Other hyperlipidemia: Secondary | ICD-10-CM | POA: Diagnosis not present

## 2019-09-20 DIAGNOSIS — E669 Obesity, unspecified: Secondary | ICD-10-CM | POA: Diagnosis not present

## 2019-09-23 DIAGNOSIS — I10 Essential (primary) hypertension: Secondary | ICD-10-CM | POA: Diagnosis not present

## 2019-09-23 DIAGNOSIS — R82998 Other abnormal findings in urine: Secondary | ICD-10-CM | POA: Diagnosis not present

## 2019-09-27 ENCOUNTER — Other Ambulatory Visit: Payer: Self-pay | Admitting: Urology

## 2019-09-27 DIAGNOSIS — I10 Essential (primary) hypertension: Secondary | ICD-10-CM | POA: Diagnosis not present

## 2019-09-27 DIAGNOSIS — K219 Gastro-esophageal reflux disease without esophagitis: Secondary | ICD-10-CM | POA: Diagnosis not present

## 2019-09-27 DIAGNOSIS — Z0000 Encounter for general adult medical examination without abnormal findings: Secondary | ICD-10-CM | POA: Diagnosis not present

## 2019-09-27 DIAGNOSIS — Z1331 Encounter for screening for depression: Secondary | ICD-10-CM | POA: Diagnosis not present

## 2019-09-27 DIAGNOSIS — E785 Hyperlipidemia, unspecified: Secondary | ICD-10-CM | POA: Diagnosis not present

## 2019-09-27 DIAGNOSIS — E291 Testicular hypofunction: Secondary | ICD-10-CM | POA: Diagnosis not present

## 2019-09-27 DIAGNOSIS — J309 Allergic rhinitis, unspecified: Secondary | ICD-10-CM | POA: Diagnosis not present

## 2019-09-27 DIAGNOSIS — R6882 Decreased libido: Secondary | ICD-10-CM | POA: Diagnosis not present

## 2019-09-27 DIAGNOSIS — R519 Headache, unspecified: Secondary | ICD-10-CM | POA: Diagnosis not present

## 2019-09-27 DIAGNOSIS — R972 Elevated prostate specific antigen [PSA]: Secondary | ICD-10-CM

## 2019-09-27 DIAGNOSIS — F5221 Male erectile disorder: Secondary | ICD-10-CM | POA: Diagnosis not present

## 2019-10-01 MED FILL — FLUTICASONE PROP 50 MCG SPR: 50 | 90 days supply | Qty: 48 | Fill #0

## 2019-10-05 MED FILL — BUTALB-ACETAMIN-CAFF 50-325: 50-325-40 | 45 days supply | Qty: 45 | Fill #1

## 2019-11-30 DIAGNOSIS — H5203 Hypermetropia, bilateral: Secondary | ICD-10-CM | POA: Diagnosis not present

## 2019-12-02 ENCOUNTER — Other Ambulatory Visit: Payer: Self-pay | Admitting: Urology

## 2019-12-03 MED FILL — TESTOSTERONE CYP 200 MG/ML: 200 | 56 days supply | Qty: 4 | Fill #0

## 2019-12-10 MED FILL — PANTOPRAZOLE SOD DR 40 MG T: 40 | 90 days supply | Qty: 90 | Fill #2

## 2019-12-10 MED FILL — ATENOLOL 50 MG TABLET: 50 | 90 days supply | Qty: 90 | Fill #2

## 2019-12-10 MED FILL — ROSUVASTATIN CALCIUM 40 MG: 40 | 90 days supply | Qty: 90 | Fill #2

## 2019-12-10 MED FILL — FENOFIBRATE 160 MG TABLET: 160 | 90 days supply | Qty: 90 | Fill #2

## 2020-02-17 ENCOUNTER — Ambulatory Visit: Payer: 59 | Admitting: Urology

## 2020-02-23 ENCOUNTER — Ambulatory Visit: Payer: 59 | Admitting: Urology

## 2020-02-23 ENCOUNTER — Encounter: Payer: Self-pay | Admitting: Urology

## 2020-02-23 ENCOUNTER — Other Ambulatory Visit: Payer: Self-pay

## 2020-02-23 VITALS — BP 147/83 | HR 71 | Ht 67.0 in | Wt 205.0 lb

## 2020-02-23 DIAGNOSIS — E291 Testicular hypofunction: Secondary | ICD-10-CM

## 2020-02-23 NOTE — Progress Notes (Signed)
02/08/20 2:15 PM   Jackson Vaughan 1961-03-08 [id number]  Referring provider: Shon Baton, MD [address] [place],  [place] [address] Chief Complaint  Patient presents with   Hypogonadism    Urologic history: 1.  Hypogonadism -Diagnosed 08/2017 -Started TRT January 2019 -Decreased libido, tiredness, fatigue  2.  Erectile dysfunction -Multiple organic risk factors -Flushing with sildenafil -Trial tadalafil 02/2019   HPI: Jackson Vaughan is a e who presents today for an annual follow up.   -No complaints today -Testosterone level 08/2019 was 627, PSA 1.3 and hematocrit 50.4. -Remains on testosterone replacement.His wife gives his injections and he was last injected 2-3 weeks ago -He describes his energy level as fair, good libido -No breast tenderness or enlargement -Mild LUTS which are stable    PMH: Past Medical History:  Diagnosis Date   GERD (gastroesophageal reflux disease)    Seasonal allergies     Surgical History: Past Surgical History:  Procedure Laterality Date   HERNIA REPAIR     RHINOPLASTY      Home Medications:  Allergies as of 02/23/2020   No Known Allergies     Medication List       Accurate as of February 23, 2020  2:15 PM. If you have any questions, ask your nurse or doctor.        STOP taking these medications   sildenafil 20 MG tablet Commonly known as: REVATIO Stopped by: [name], MD     TAKE these medications   ATENOLOL PO Take by mouth.   butalbital-acetaminophen-caffeine 50-325-40 MG tablet Commonly known as: FIORICET   CRESTOR PO Take by mouth.   fenofibrate 160 MG tablet Take 160 mg by mouth daily.   FISH OIL PO Take by mouth.   fluticasone 50 MCG/ACT nasal spray Commonly known as: FLONASE   pantoprazole 40 MG tablet Commonly known as: PROTONIX Take 40 mg by mouth daily.   tadalafil 20 MG tablet Commonly known as: CIALIS 1 tablet by mouth 30-60 minutes prior to intercourse   testosterone  cypionate 200 MG/ML injection Commonly known as: DEPOTESTOSTERONE CYPIONATE INJECT 1 ML (200 MG TOTAL) INTO THE MUSCLE EVERY 14 DAYS.   vitamin C 500 MG tablet Commonly known as: ASCORBIC ACID Take 500 mg daily by mouth.   vitamin E 180 MG (400 UNITS) capsule Take 400 Units daily by mouth.       Allergies: No Known Allergies  Family History: Family History  Problem Relation Age of Onset   Kidney cancer Neg Hx    Kidney disease Neg Hx    Prostate cancer Neg Hx     Social History:  reports that he has never smoked. He has never used smokeless tobacco. He reports that he does not drink alcohol and does not use drugs.   Physical Exam: BP (!) 147/83    Pulse 71    Ht 5\' 7"  (1.702 m)    Wt 205 lb (93 kg)    BMI 32.11 kg/m   Constitutional:  Alert and oriented, No acute distress. HEENT: [place] AT, moist mucus membranes.  Trachea midline, no masses. Cardiovascular: No clubbing, cyanosis, or edema. Respiratory: Normal respiratory effort, no increased work of breathing. GI: Abdomen is soft, nontender, nondistended, no abdominal masses GU: Prostate 35 grams, smooth without nodules.  Skin: No rashes, bruises or suspicious lesions. Neurologic: Grossly intact, no focal deficits, moving all 4 extremities. Psychiatric: Normal mood and affect.   Assessment & Plan:  1.Hypogonadism   -Stable on TRT  -  Ordered hematocrit, PSA and testosterone today  -6 month lab visit for  Hematocrit and testosterone  -Will continue annual follow up  [facility] [address] [place], [place] [address] [phone]  I, Joneen Boers Peace, am acting as a Education administrator for Dr. Nicki Reaper C. Kaitlen Redford.  I have reviewed the above documentation for accuracy and completeness, and I agree with the above.   [name], MD

## 2020-02-24 ENCOUNTER — Telehealth: Payer: Self-pay | Admitting: *Deleted

## 2020-02-24 LAB — HEMATOCRIT: Hematocrit: 47.7 % (ref 37.5–51.0)

## 2020-02-24 LAB — TESTOSTERONE: Testosterone: 377 ng/dL (ref 264–916)

## 2020-02-24 LAB — PSA: Prostate Specific Ag, Serum: 1.3 ng/mL (ref 0.0–4.0)

## 2020-02-24 NOTE — Telephone Encounter (Signed)
-----   Message from [name], MD sent at 02/24/2020  8:14 AM EDT ----- Testosterone level looks good at 377.  Hematocrit normal and PSA stable

## 2020-02-28 NOTE — Telephone Encounter (Signed)
Notified patient as instructed, patient pleased. Discussed follow-up appointments, patient agrees  

## 2020-03-10 DIAGNOSIS — E291 Testicular hypofunction: Secondary | ICD-10-CM | POA: Diagnosis not present

## 2020-03-10 DIAGNOSIS — I10 Essential (primary) hypertension: Secondary | ICD-10-CM | POA: Diagnosis not present

## 2020-03-10 DIAGNOSIS — R519 Headache, unspecified: Secondary | ICD-10-CM | POA: Diagnosis not present

## 2020-03-10 DIAGNOSIS — E669 Obesity, unspecified: Secondary | ICD-10-CM | POA: Diagnosis not present

## 2020-03-10 DIAGNOSIS — J309 Allergic rhinitis, unspecified: Secondary | ICD-10-CM | POA: Diagnosis not present

## 2020-03-10 DIAGNOSIS — R739 Hyperglycemia, unspecified: Secondary | ICD-10-CM | POA: Diagnosis not present

## 2020-03-14 MED FILL — PANTOPRAZOLE SOD DR 40 MG T: 40 | 90 days supply | Qty: 90 | Fill #3

## 2020-03-14 MED FILL — FENOFIBRATE 160 MG TABLET: 160 | 90 days supply | Qty: 90 | Fill #3

## 2020-03-14 MED FILL — ATENOLOL 50 MG TABLET: 50 | 90 days supply | Qty: 90 | Fill #3

## 2020-03-14 MED FILL — ROSUVASTATIN CALCIUM 40 MG: 40 | 90 days supply | Qty: 90 | Fill #3

## 2020-03-24 DIAGNOSIS — B078 Other viral warts: Secondary | ICD-10-CM | POA: Diagnosis not present

## 2020-03-24 DIAGNOSIS — D2262 Melanocytic nevi of left upper limb, including shoulder: Secondary | ICD-10-CM | POA: Diagnosis not present

## 2020-03-24 DIAGNOSIS — D225 Melanocytic nevi of trunk: Secondary | ICD-10-CM | POA: Diagnosis not present

## 2020-03-24 DIAGNOSIS — D2271 Melanocytic nevi of right lower limb, including hip: Secondary | ICD-10-CM | POA: Diagnosis not present

## 2020-03-24 DIAGNOSIS — L820 Inflamed seborrheic keratosis: Secondary | ICD-10-CM | POA: Diagnosis not present

## 2020-03-24 DIAGNOSIS — D2272 Melanocytic nevi of left lower limb, including hip: Secondary | ICD-10-CM | POA: Diagnosis not present

## 2020-03-24 DIAGNOSIS — L821 Other seborrheic keratosis: Secondary | ICD-10-CM | POA: Diagnosis not present

## 2020-03-24 DIAGNOSIS — L538 Other specified erythematous conditions: Secondary | ICD-10-CM | POA: Diagnosis not present

## 2020-03-24 DIAGNOSIS — D485 Neoplasm of uncertain behavior of skin: Secondary | ICD-10-CM | POA: Diagnosis not present

## 2020-03-24 DIAGNOSIS — D2261 Melanocytic nevi of right upper limb, including shoulder: Secondary | ICD-10-CM | POA: Diagnosis not present

## 2020-03-24 DIAGNOSIS — L918 Other hypertrophic disorders of the skin: Secondary | ICD-10-CM | POA: Diagnosis not present

## 2020-05-02 MED FILL — BUTALB-ACETAMIN-CAFF 50-325: 50-325-40 | 45 days supply | Qty: 45 | Fill #3

## 2020-05-31 ENCOUNTER — Other Ambulatory Visit (HOSPITAL_COMMUNITY): Payer: Self-pay | Admitting: Internal Medicine

## 2020-05-31 ENCOUNTER — Other Ambulatory Visit: Payer: Self-pay | Admitting: Urology

## 2020-05-31 MED FILL — FLUTICASONE PROP 50 MCG SPR: 50 | 90 days supply | Qty: 48 | Fill #0

## 2020-05-31 MED FILL — TESTOSTERONE CYP 200 MG/ML: 200 | 56 days supply | Qty: 4 | Fill #0

## 2020-06-01 ENCOUNTER — Other Ambulatory Visit (HOSPITAL_COMMUNITY): Payer: Self-pay | Admitting: Internal Medicine

## 2020-06-01 MED FILL — PANTOPRAZOLE SOD DR 40 MG T: 40 | 90 days supply | Qty: 90 | Fill #0

## 2020-06-01 MED FILL — ATENOLOL 50 MG TABLET: 50 | 90 days supply | Qty: 90 | Fill #0

## 2020-06-01 MED FILL — ROSUVASTATIN CALCIUM 40 MG: 40 | 90 days supply | Qty: 90 | Fill #0

## 2020-06-01 MED FILL — FENOFIBRATE 160 MG TABLET: 160 | 90 days supply | Qty: 90 | Fill #0

## 2020-06-22 ENCOUNTER — Other Ambulatory Visit: Payer: Self-pay

## 2020-06-22 ENCOUNTER — Telehealth (INDEPENDENT_AMBULATORY_CARE_PROVIDER_SITE_OTHER): Payer: Self-pay | Admitting: Gastroenterology

## 2020-06-22 DIAGNOSIS — Z1211 Encounter for screening for malignant neoplasm of colon: Secondary | ICD-10-CM

## 2020-06-22 MED ORDER — NA SULFATE-K SULFATE-MG SULF 17.5-3.13-1.6 GM/177ML PO SOLN: 1.0000 | Freq: Once | ORAL | 0 refills | Status: DC

## 2020-06-22 MED FILL — SUPREP BOWEL PREP KIT: 17.5-3.13-1 | 1 days supply | Qty: 354 | Fill #0

## 2020-06-22 NOTE — Progress Notes (Signed)
Gastroenterology Pre-Procedure Review  Request Date: Monday 07/10/20 Requesting Physician: Dr. Bonna Gains  PATIENT REVIEW QUESTIONS: The patient responded to the following health history questions as indicated:    1. Are you having any GI issues? no 2. Do you have a personal history of Polyps? no 3. Do you have a family history of Colon Cancer or Polyps? no 4. Diabetes Mellitus? no 5. Joint replacements in the past 12 months?no 6. Major health problems in the past 3 months?no 7. Any artificial heart valves, MVP, or defibrillator?no    MEDICATIONS & ALLERGIES:    Patient reports the following regarding taking any anticoagulation/antiplatelet therapy:   Plavix, Coumadin, Eliquis, Xarelto, Lovenox, Pradaxa, Brilinta, or Effient? no Aspirin? no  Patient confirms/reports the following medications:  Current Outpatient Medications  Medication Sig Dispense Refill  . ATENOLOL PO Take by mouth.    . butalbital-acetaminophen-caffeine (FIORICET, ESGIC) 50-325-40 MG tablet     . fenofibrate 160 MG tablet Take 160 mg by mouth daily.  3  . fluticasone (FLONASE) 50 MCG/ACT nasal spray     . Na Sulfate-K Sulfate-Mg Sulf 17.5-3.13-1.6 GM/177ML SOLN Take 1 kit by mouth once for 1 dose. 354 mL 0  . Omega-3 Fatty Acids (FISH OIL PO) Take by mouth.    . pantoprazole (PROTONIX) 40 MG tablet Take 40 mg by mouth daily.  2  . Rosuvastatin Calcium (CRESTOR PO) Take by mouth.    . tadalafil (CIALIS) 20 MG tablet 1 tablet by mouth 30-60 minutes prior to intercourse 30 tablet 1  . testosterone cypionate (DEPOTESTOSTERONE CYPIONATE) 200 MG/ML injection INJECT 1 ML (200 MG TOTAL) INTO THE MUSCLE EVERY 14 DAYS. 4 mL 1  . vitamin C (ASCORBIC ACID) 500 MG tablet Take 500 mg daily by mouth.    . vitamin E 400 UNIT capsule Take 400 Units daily by mouth.     No current facility-administered medications for this visit.    Patient confirms/reports the following allergies:  No Known Allergies  No orders of the  defined types were placed in this encounter.   AUTHORIZATION INFORMATION Primary Insurance: 1D#: Group #:  Secondary Insurance: 1D#: Group #:  SCHEDULE INFORMATION: Date: Monday 07/10/20 Time: Location:[facility]

## 2020-07-04 ENCOUNTER — Encounter: Payer: Self-pay | Admitting: Gastroenterology

## 2020-07-04 ENCOUNTER — Other Ambulatory Visit: Payer: Self-pay

## 2020-07-06 ENCOUNTER — Other Ambulatory Visit
Admission: RE | Admit: 2020-07-06 | Discharge: 2020-07-06 | Disposition: A | Discharge status: Home / Self Care | Payer: 59 | Source: Ambulatory Visit | Attending: Gastroenterology | Admitting: Gastroenterology

## 2020-07-06 ENCOUNTER — Other Ambulatory Visit: Payer: Self-pay

## 2020-07-06 DIAGNOSIS — Z20822 Contact with and (suspected) exposure to covid-19: Secondary | ICD-10-CM | POA: Insufficient documentation

## 2020-07-06 DIAGNOSIS — Z01812 Encounter for preprocedural laboratory examination: Secondary | ICD-10-CM | POA: Diagnosis not present

## 2020-07-07 LAB — SARS CORONAVIRUS 2 (TAT 6-24 HRS): SARS Coronavirus 2: NEGATIVE

## 2020-07-07 NOTE — Discharge Instructions (Signed)
General Anesthesia, Adult, Care After This sheet gives you information about how to care for yourself after your procedure. Your health care provider may also give you more specific instructions. If you have problems or questions, contact your health care provider. What can I expect after the procedure? After the procedure, the following side effects are common:  Pain or discomfort at the IV site.  Nausea.  Vomiting.  Sore throat.  Trouble concentrating.  Feeling cold or chills.  Weak or tired.  Sleepiness and fatigue.  Soreness and body aches. These side effects can affect parts of the body that were not involved in surgery. Follow these instructions at home:  For at least 24 hours after the procedure:  Have a responsible adult stay with you. It is important to have someone help care for you until you are awake and alert.  Rest as needed.  Do not: ? Participate in activities in which you could fall or become injured. ? Drive. ? Use heavy machinery. ? Drink alcohol. ? Take sleeping pills or medicines that cause drowsiness. ? Make important decisions or sign legal documents. ? Take care of children on your own. Eating and drinking  Follow any instructions from your health care provider about eating or drinking restrictions.  When you feel hungry, start by eating small amounts of foods that are soft and easy to digest (bland), such as toast. Gradually return to your regular diet.  Drink enough fluid to keep your urine pale yellow.  If you vomit, rehydrate by drinking water, juice, or clear broth. General instructions  If you have sleep apnea, surgery and certain medicines can increase your risk for breathing problems. Follow instructions from your health care provider about wearing your sleep device: ? Anytime you are sleeping, including during daytime naps. ? While taking prescription pain medicines, sleeping medicines, or medicines that make you drowsy.  Return to  your normal activities as told by your health care provider. Ask your health care provider what activities are safe for you.  Take over-the-counter and prescription medicines only as told by your health care provider.  If you smoke, do not smoke without supervision.  Keep all follow-up visits as told by your health care provider. This is important. Contact a health care provider if:  You have nausea or vomiting that does not get better with medicine.  You cannot eat or drink without vomiting.  You have pain that does not get better with medicine.  You are unable to pass urine.  You develop a skin rash.  You have a fever.  You have redness around your IV site that gets worse. Get help right away if:  You have difficulty breathing.  You have chest pain.  You have blood in your urine or stool, or you vomit blood. Summary  After the procedure, it is common to have a sore throat or nausea. It is also common to feel tired.  Have a responsible adult stay with you for the first 24 hours after general anesthesia. It is important to have someone help care for you until you are awake and alert.  When you feel hungry, start by eating small amounts of foods that are soft and easy to digest (bland), such as toast. Gradually return to your regular diet.  Drink enough fluid to keep your urine pale yellow.  Return to your normal activities as told by your health care provider. Ask your health care provider what activities are safe for you. This information is not   intended to replace advice given to you by your health care provider. Make sure you discuss any questions you have with your health care provider. Document Revised: 08/29/2017 Document Reviewed: 04/11/2017 Elsevier Patient Education  2020 Elsevier Inc.  

## 2020-07-10 ENCOUNTER — Encounter: Payer: Self-pay | Admitting: Gastroenterology

## 2020-07-10 ENCOUNTER — Ambulatory Visit: Payer: 59 | Admitting: Anesthesiology

## 2020-07-10 ENCOUNTER — Encounter
Admission: RE | Disposition: A | Discharge status: Home / Self Care | Payer: Self-pay | Source: Home / Self Care | Attending: Gastroenterology

## 2020-07-10 ENCOUNTER — Other Ambulatory Visit: Payer: Self-pay

## 2020-07-10 ENCOUNTER — Ambulatory Visit
Admission: RE | Admit: 2020-07-10 | Discharge: 2020-07-10 | Disposition: A | Discharge status: Home / Self Care | Payer: 59 | Attending: Gastroenterology | Admitting: Gastroenterology

## 2020-07-10 DIAGNOSIS — Z1211 Encounter for screening for malignant neoplasm of colon: Secondary | ICD-10-CM

## 2020-07-10 DIAGNOSIS — K5730 Diverticulosis of large intestine without perforation or abscess without bleeding: Secondary | ICD-10-CM | POA: Diagnosis not present

## 2020-07-10 DIAGNOSIS — K635 Polyp of colon: Secondary | ICD-10-CM

## 2020-07-10 DIAGNOSIS — Z79899 Other long term (current) drug therapy: Secondary | ICD-10-CM | POA: Insufficient documentation

## 2020-07-10 DIAGNOSIS — Z87891 Personal history of nicotine dependence: Secondary | ICD-10-CM | POA: Insufficient documentation

## 2020-07-10 DIAGNOSIS — D123 Benign neoplasm of transverse colon: Secondary | ICD-10-CM | POA: Insufficient documentation

## 2020-07-10 DIAGNOSIS — D125 Benign neoplasm of sigmoid colon: Secondary | ICD-10-CM | POA: Diagnosis not present

## 2020-07-10 HISTORY — DX: Dizziness and giddiness: R42

## 2020-07-10 HISTORY — PX: POLYPECTOMY: SHX5525

## 2020-07-10 HISTORY — PX: COLONOSCOPY WITH PROPOFOL: SHX5780

## 2020-07-10 HISTORY — DX: Migraine, unspecified, not intractable, without status migrainosus: G43.909

## 2020-07-10 HISTORY — DX: Essential (primary) hypertension: I10

## 2020-07-10 SURGERY — COLONOSCOPY WITH PROPOFOL
Anesthesia: General | Site: Rectum

## 2020-07-10 MED ORDER — STERILE WATER FOR IRRIGATION IR SOLN: Status: DC | PRN

## 2020-07-10 MED ORDER — PROPOFOL 10 MG/ML IV BOLUS
INTRAVENOUS | Status: DC | PRN
  Administered 2020-07-10 (×2): 40 mg via INTRAVENOUS
  Administered 2020-07-10: 20 mg via INTRAVENOUS
  Administered 2020-07-10: 40 mg via INTRAVENOUS
  Administered 2020-07-10: 80 mg via INTRAVENOUS
  Administered 2020-07-10: 40 mg via INTRAVENOUS

## 2020-07-10 MED ORDER — LACTATED RINGERS IV SOLN: INTRAVENOUS | Status: DC

## 2020-07-10 MED ORDER — SODIUM CHLORIDE 0.9 % IV SOLN: INTRAVENOUS | Status: DC

## 2020-07-10 MED ORDER — LIDOCAINE HCL (CARDIAC) PF 100 MG/5ML IV SOSY
PREFILLED_SYRINGE | INTRAVENOUS | Status: DC | PRN
  Administered 2020-07-10: 40 mg via INTRAVENOUS

## 2020-07-10 SURGICAL SUPPLY — 11 items
FCP ESCP3.2XJMB 240X2.8X (MISCELLANEOUS) ×1
FORCEPS BIOP RJ4 240 W/NDL (MISCELLANEOUS) ×2
FORCEPS ESCP3.2XJMB 240X2.8X (MISCELLANEOUS) ×1 IMPLANT
GOWN CVR UNV OPN BCK APRN NK (MISCELLANEOUS) ×2 IMPLANT
GOWN ISOL THUMB LOOP REG UNIV (MISCELLANEOUS) ×4
KIT PRC NS LF DISP ENDO (KITS) ×1 IMPLANT
KIT PROCEDURE OLYMPUS (KITS) ×2
MANIFOLD NEPTUNE II (INSTRUMENTS) ×2 IMPLANT
SNARE COLD EXACTO (MISCELLANEOUS) ×2 IMPLANT
TRAP ETRAP POLY (MISCELLANEOUS) ×2 IMPLANT
WATER STERILE IRR 250ML POUR (IV SOLUTION) ×2 IMPLANT

## 2020-07-10 NOTE — H&P (Signed)
Vonda Antigua, MD [address], [place], [place], [place], [address] [address], [place], [place], [place], [address] Phone: [phone]  Fax: [phone]  Primary Care Physician:  Shon Baton, MD   Pre-Procedure History & Physical: HPI:  Jackson Vaughan is a 59 y.o. male is here for a colonoscopy.   Past Medical History:  Diagnosis Date  . GERD (gastroesophageal reflux disease)   . Hypertension   . Migraine headache    2x/month  . Seasonal allergies   . Vertigo    1 episode, approx 20 yrs ago    Past Surgical History:  Procedure Laterality Date  . HERNIA REPAIR    . RHINOPLASTY      Prior to Admission medications   Medication Sig Start Date End Date Taking? Authorizing Provider  ATENOLOL PO Take by mouth.   Yes [provider]  butalbital-acetaminophen-caffeine (FIORICET, ESGIC) [phone] MG tablet  05/13/18  Yes [provider]  fenofibrate 160 MG tablet Take 160 mg by mouth daily. 09/29/17  Yes [provider]  fluticasone Asencion Islam) 50 MCG/ACT nasal spray  07/06/18  Yes [provider]  Omega-3 Fatty Acids (FISH OIL PO) Take by mouth.   Yes [provider]  pantoprazole (PROTONIX) 40 MG tablet Take 40 mg by mouth daily. 09/29/17  Yes [provider]  Rosuvastatin Calcium (CRESTOR PO) Take by mouth.   Yes [provider]  testosterone cypionate (DEPOTESTOSTERONE CYPIONATE) 200 MG/ML injection INJECT 1 ML (200 MG TOTAL) INTO THE MUSCLE EVERY 14 DAYS. 05/31/20  Yes Stoioff, Ronda Fairly, MD  vitamin C (ASCORBIC ACID) 500 MG tablet Take 500 mg daily by mouth.   Yes [provider]  vitamin E 400 UNIT capsule Take 400 Units daily by mouth.   Yes [provider]  tadalafil (CIALIS) 20 MG tablet 1 tablet by mouth 30-60 minutes prior to intercourse Patient not taking: Reported on 07/04/2020 02/18/19   [name], MD    Allergies as of 06/22/2020  . (No Known Allergies)    Family  History  Problem Relation Age of Onset  . Kidney cancer Neg Hx   . Kidney disease Neg Hx   . Prostate cancer Neg Hx     Social History   Socioeconomic History  . Marital status: Married    Spouse name: Not on file  . Number of children: Not on file  . Years of education: Not on file  . Highest education level: Not on file  Occupational History  . Not on file  Tobacco Use  . Smoking status: Former Smoker    Quit date: 2005    Years since quitting: 16.8  . Smokeless tobacco: Never Used  . Tobacco comment: off and on for 15 yrs  Vaping Use  . Vaping Use: Never used  Substance and Sexual Activity  . Alcohol use: No    Comment: rare  . Drug use: Never  . Sexual activity: Yes    Birth control/protection: None  Other Topics Concern  . Not on file  Social History Narrative  . Not on file   Social Determinants of Health   Financial Resource Strain:   . Difficulty of Paying Living Expenses: Not on file  Food Insecurity:   . Worried About Charity fundraiser in the Last Year: Not on file  . Ran Out of Food in the Last Year: Not on file  Transportation Needs:   . Lack of Transportation (Medical): Not on file  . Lack  of Transportation (Non-Medical): Not on file  Physical Activity:   . Days of Exercise per Week: Not on file  . Minutes of Exercise per Session: Not on file  Stress:   . Feeling of Stress : Not on file  Social Connections:   . Frequency of Communication with Friends and Family: Not on file  . Frequency of Social Gatherings with Friends and Family: Not on file  . Attends Religious Services: Not on file  . Active Member of Clubs or Organizations: Not on file  . Attends Archivist Meetings: Not on file  . Marital Status: Not on file  Intimate Partner Violence:   . Fear of Current or Ex-Partner: Not on file  . Emotionally Abused: Not on file  . Physically Abused: Not on file  . Sexually Abused: Not on file    Review of Systems: See HPI, otherwise  negative ROS  Physical Exam: BP (!) 143/83   Pulse 65   Temp (!) 97.5 F (36.4 C)   Resp 16   Ht 5\' 7"  (1.702 m)   Wt 93 kg   SpO2 99%   BMI 32.11 kg/m  General:   Alert,  pleasant and cooperative in NAD Head:  Normocephalic and atraumatic. Neck:  Supple; no masses or thyromegaly. Lungs:  Clear throughout to auscultation, normal respiratory effort.    Heart:  +S1, +S2, Regular rate and rhythm, No edema. Abdomen:  Soft, nontender and nondistended. Normal bowel sounds, without guarding, and without rebound.   Neurologic:  Alert and  oriented x4;  grossly normal neurologically.  Impression/Plan: Jackson Vaughan is here for a colonoscopy to be performed for average risk screening.  Risks, benefits, limitations, and alternatives regarding  colonoscopy have been reviewed with the patient.  Questions have been answered.  All parties agreeable.   Virgel Manifold, MD  07/10/2020, 12:25 PM

## 2020-07-10 NOTE — Anesthesia Preprocedure Evaluation (Addendum)
Anesthesia Evaluation  Patient identified by MRN, date of birth, ID band Patient awake    Reviewed: Allergy & Precautions, NPO status , Patient's Chart, lab work & pertinent test results  Airway Mallampati: II  TM Distance: >3 FB Neck ROM: Full    Dental no notable dental hx.    Pulmonary former smoker,    Pulmonary exam normal        Cardiovascular hypertension, Normal cardiovascular exam     Neuro/Psych  Headaches, negative psych ROS   GI/Hepatic Neg liver ROS, GERD  Controlled,  Endo/Other  BMI 32  Renal/GU negative Renal ROS  negative genitourinary   Musculoskeletal negative musculoskeletal ROS (+)   Abdominal Normal abdominal exam  (+)   Peds  Hematology negative hematology ROS (+)   Anesthesia Other Findings   Reproductive/Obstetrics                            Anesthesia Physical Anesthesia Plan  ASA: II  Anesthesia Plan: General   Post-op Pain Management:    Induction: Intravenous  PONV Risk Score and Plan: 2 and Propofol infusion, TIVA and Treatment may vary due to age or medical condition  Airway Management Planned: Natural Airway and Nasal Cannula  Additional Equipment: None  Intra-op Plan:   Post-operative Plan:   Informed Consent: I have reviewed the patients History and Physical, chart, labs and discussed the procedure including the risks, benefits and alternatives for the proposed anesthesia with the patient or authorized representative who has indicated his/her understanding and acceptance.     Dental advisory given  Plan Discussed with: CRNA  Anesthesia Plan Comments:         Anesthesia Quick Evaluation

## 2020-07-10 NOTE — Anesthesia Procedure Notes (Signed)
Procedure Name: MAC Date/Time: 07/10/2020 12:42 PM Performed by: Silvana Newness, CRNA Pre-anesthesia Checklist: Patient identified, Emergency Drugs available, Suction available, Patient being monitored and Timeout performed Patient Re-evaluated:Patient Re-evaluated prior to induction Oxygen Delivery Method: Nasal cannula Placement Confirmation: positive ETCO2

## 2020-07-10 NOTE — Anesthesia Postprocedure Evaluation (Signed)
Anesthesia Post Note  Patient: Jackson Vaughan  Procedure(s) Performed: COLONOSCOPY WITH BIOPSY (N/A Rectum) POLYPECTOMY (N/A Rectum)     Patient location during evaluation: PACU Anesthesia Type: General Level of consciousness: awake and alert Pain management: pain level controlled Vital Signs Assessment: post-procedure vital signs reviewed and stable Respiratory status: spontaneous breathing and nonlabored ventilation Cardiovascular status: blood pressure returned to baseline Postop Assessment: no apparent nausea or vomiting Anesthetic complications: no   No complications documented.  Oney Tatlock [employer]

## 2020-07-10 NOTE — Op Note (Signed)
[facility] Patient Name: Jackson Vaughan Procedure Date: 07/10/2020 12:33 PM MRN: [id number] Account #: [id number] Date of Birth: May 23, 1961 Admit Type: Ambulatory Age: 59 Room: [facility] OR ROOM 01 Gender: Male Note Status: Finalized Procedure:             Colonoscopy Indications:           Screening for colorectal malignant neoplasm Providers:             Dilon Lank B. Bonna Gains MD, MD Referring MD:          Shon Baton MD, MD (Referring MD) Medicines:             Monitored Anesthesia Care Complications:         No immediate complications. Procedure:             Pre-Anesthesia Assessment:                        - ASA Grade Assessment: II - A patient with mild                         systemic disease.                        - Prior to the procedure, a History and Physical was                         performed, and patient medications, allergies and                         sensitivities were reviewed. The patient's tolerance                         of previous anesthesia was reviewed.                        - The risks and benefits of the procedure and the                         sedation options and risks were discussed with the                         patient. All questions were answered and informed                         consent was obtained.                        - Patient identification and proposed procedure were                         verified prior to the procedure by the physician, the                         nurse, the anesthesiologist, the anesthetist and the                         technician. The procedure was verified in the                         procedure  room.                        After obtaining informed consent, the colonoscope was                         passed under direct vision. Throughout the procedure,                         the patient's blood pressure, pulse, and oxygen                         saturations were monitored  continuously. The was                         introduced through the anus and advanced to the the                         cecum, identified by appendiceal orifice and ileocecal                         valve. The colonoscopy was performed with ease. The                         patient tolerated the procedure well. The quality of                         the bowel preparation was good. Findings:      The perianal and digital rectal examinations were normal.      Two sessile polyps were found in the transverse colon. The polyps were 3       to 4 mm in size. These polyps were removed with a jumbo cold forceps.       Resection and retrieval were complete.      Two sessile polyps were found in the sigmoid colon and transverse colon.       The polyps were 6 to 7 mm in size. These polyps were removed with a cold       snare. Resection and retrieval were complete.      Four sessile polyps were found in the sigmoid colon. The polyps were 5       to 6 mm in size. These polyps were removed with a cold snare. Resection       and retrieval were complete.      Multiple diverticula were found in the sigmoid colon.      The exam was otherwise without abnormality.      The rectum, sigmoid colon, descending colon, transverse colon, ascending       colon and cecum appeared normal.      Anal papilla(e) were hypertrophied.      No additional abnormalities were found on retroflexion. Impression:            - Two 3 to 4 mm polyps in the transverse colon,                         removed with a jumbo cold forceps. Resected and                         retrieved.                        -  Two 6 to 7 mm polyps in the sigmoid colon and in the                         transverse colon, removed with a cold snare. Resected                         and retrieved.                        - Four 5 to 6 mm polyps in the sigmoid colon, removed                         with a cold snare. Resected and retrieved.                         - Diverticulosis in the sigmoid colon.                        - The examination was otherwise normal.                        - The rectum, sigmoid colon, descending colon,                         transverse colon, ascending colon and cecum are normal.                        - Anal papilla(e) were hypertrophied. Recommendation:        - Referral to Dr. Hampton Abbot for evaluation of                         hypertrophied anal papillae (evaluate if it needs                         biopsies)                        - Discharge patient to home (with escort).                        - High fiber diet.                        - Advance diet as tolerated.                        - Continue present medications.                        - Await pathology results.                        - Repeat colonoscopy date to be determined after                         pending pathology results are reviewed.                        - The findings and recommendations were discussed with  the patient.                        - The findings and recommendations were discussed with                         the patient's family.                        - Return to primary care physician as previously                         scheduled. Procedure Code(s):     --- Professional ---                        [phone], Colonoscopy, flexible; with removal of                         tumor(s), polyp(s), or other lesion(s) by snare                         technique                        45380, 64, Colonoscopy, flexible; with biopsy, single                         or multiple Diagnosis Code(s):     --- Professional ---                        K63.5, Polyp of colon                        Z12.11, Encounter for screening for malignant neoplasm                         of colon CPT copyright 2019 American Medical Association. All rights reserved. The codes documented in this report are preliminary and upon coder review  may  be revised to meet current compliance requirements.  Vonda Antigua, MD Margretta Sidle B. Bonna Gains MD, MD 07/10/2020 1:13:55 PM This report has been signed electronically. Number of Addenda: 0 Note Initiated On: 07/10/2020 12:33 PM Scope Withdrawal Time: 0 hours 19 minutes 6 seconds  Total Procedure Duration: 0 hours 21 minutes 15 seconds  Estimated Blood Loss:  Estimated blood loss: none.      [facility]

## 2020-07-10 NOTE — Transfer of Care (Signed)
Immediate Anesthesia Transfer of Care Note  Patient: Jackson Vaughan  Procedure(s) Performed: COLONOSCOPY WITH BIOPSY (N/A Rectum) POLYPECTOMY (N/A Rectum)  Patient Location: PACU  Anesthesia Type: General  Level of Consciousness: awake, alert  and patient cooperative  Airway and Oxygen Therapy: Patient Spontanous Breathing and Patient connected to supplemental oxygen  Post-op Assessment: Post-op Vital signs reviewed, Patient's Cardiovascular Status Stable, Respiratory Function Stable, Patent Airway and No signs of Nausea or vomiting  Post-op Vital Signs: Reviewed and stable  Complications: No complications documented.

## 2020-07-11 ENCOUNTER — Encounter: Payer: Self-pay | Admitting: Gastroenterology

## 2020-07-12 ENCOUNTER — Encounter: Payer: Self-pay | Admitting: Gastroenterology

## 2020-07-12 LAB — SURGICAL PATHOLOGY

## 2020-07-13 ENCOUNTER — Telehealth: Payer: Self-pay

## 2020-07-13 DIAGNOSIS — K6289 Other specified diseases of anus and rectum: Secondary | ICD-10-CM

## 2020-07-13 NOTE — Telephone Encounter (Signed)
-----   Message from Virgel Manifold, MD sent at 07/12/2020  1:24 PM EDT ----- Herb Grays please see his procedure report in regard to recommendations for surgery referral

## 2020-08-18 ENCOUNTER — Other Ambulatory Visit: Payer: Self-pay | Admitting: Family Medicine

## 2020-08-21 ENCOUNTER — Other Ambulatory Visit: Payer: Self-pay | Admitting: Family Medicine

## 2020-08-21 DIAGNOSIS — E291 Testicular hypofunction: Secondary | ICD-10-CM

## 2020-08-24 ENCOUNTER — Other Ambulatory Visit: Payer: Self-pay

## 2020-08-24 ENCOUNTER — Other Ambulatory Visit: Payer: 59

## 2020-08-24 DIAGNOSIS — E291 Testicular hypofunction: Secondary | ICD-10-CM | POA: Diagnosis not present

## 2020-08-25 ENCOUNTER — Telehealth: Payer: Self-pay | Admitting: *Deleted

## 2020-08-25 LAB — TESTOSTERONE: Testosterone: 748 ng/dL (ref 264–916)

## 2020-08-25 LAB — HEMATOCRIT: Hematocrit: 45.2 % (ref 37.5–51.0)

## 2020-08-25 NOTE — Telephone Encounter (Signed)
Notified patient as instructed, patient pleased. Discussed follow-up appointments, patient agrees  

## 2020-08-25 NOTE — Telephone Encounter (Signed)
-----   Message from [name], MD sent at 08/25/2020  7:49 AM EST ----- Testosterone level looks good at 748.  Hematocrit was normal

## 2020-09-11 ENCOUNTER — Other Ambulatory Visit (HOSPITAL_COMMUNITY): Payer: Self-pay | Admitting: Internal Medicine

## 2020-09-11 MED FILL — PANTOPRAZOLE SOD DR 40 MG T: 40 | 90 days supply | Qty: 90 | Fill #1

## 2020-09-11 MED FILL — FLUTICASONE PROP 50 MCG SPR: 50 | 90 days supply | Qty: 48 | Fill #1

## 2020-09-11 MED FILL — ATENOLOL 50 MG TABLET: 50 | 90 days supply | Qty: 90 | Fill #1

## 2020-09-11 MED FILL — FENOFIBRATE 160 MG TABLET: 160 | 90 days supply | Qty: 90 | Fill #1

## 2020-09-11 MED FILL — BUTALB-ACETAMIN-CAFF 50-325: 50-325-40 | 45 days supply | Qty: 45 | Fill #0

## 2020-09-11 MED FILL — ROSUVASTATIN CALCIUM 40 MG: 40 | 90 days supply | Qty: 90 | Fill #1

## 2020-09-21 DIAGNOSIS — R739 Hyperglycemia, unspecified: Secondary | ICD-10-CM | POA: Diagnosis not present

## 2020-09-21 DIAGNOSIS — Z125 Encounter for screening for malignant neoplasm of prostate: Secondary | ICD-10-CM | POA: Diagnosis not present

## 2020-09-21 DIAGNOSIS — Z0000 Encounter for general adult medical examination without abnormal findings: Secondary | ICD-10-CM | POA: Diagnosis not present

## 2020-09-21 DIAGNOSIS — E785 Hyperlipidemia, unspecified: Secondary | ICD-10-CM | POA: Diagnosis not present

## 2020-09-28 DIAGNOSIS — I10 Essential (primary) hypertension: Secondary | ICD-10-CM | POA: Diagnosis not present

## 2020-09-28 DIAGNOSIS — F5221 Male erectile disorder: Secondary | ICD-10-CM | POA: Diagnosis not present

## 2020-09-28 DIAGNOSIS — R6882 Decreased libido: Secondary | ICD-10-CM | POA: Diagnosis not present

## 2020-09-28 DIAGNOSIS — E291 Testicular hypofunction: Secondary | ICD-10-CM | POA: Diagnosis not present

## 2020-09-28 DIAGNOSIS — Z0000 Encounter for general adult medical examination without abnormal findings: Secondary | ICD-10-CM | POA: Diagnosis not present

## 2020-09-28 DIAGNOSIS — E669 Obesity, unspecified: Secondary | ICD-10-CM | POA: Diagnosis not present

## 2020-09-28 DIAGNOSIS — R739 Hyperglycemia, unspecified: Secondary | ICD-10-CM | POA: Diagnosis not present

## 2020-09-28 DIAGNOSIS — E785 Hyperlipidemia, unspecified: Secondary | ICD-10-CM | POA: Diagnosis not present

## 2020-09-28 DIAGNOSIS — R945 Abnormal results of liver function studies: Secondary | ICD-10-CM | POA: Diagnosis not present

## 2020-10-26 MED FILL — TESTOSTERONE CYP 200 MG/ML: 200 | 56 days supply | Qty: 4 | Fill #1

## 2020-12-11 ENCOUNTER — Other Ambulatory Visit (HOSPITAL_COMMUNITY): Payer: Self-pay

## 2020-12-11 MED FILL — Pantoprazole Sodium EC Tab 40 MG (Base Equiv): ORAL | 90 days supply | Qty: 90 | Fill #0 | Status: AC

## 2020-12-11 MED FILL — Fenofibrate Tab 160 MG: ORAL | 90 days supply | Qty: 90 | Fill #0 | Status: AC

## 2020-12-11 MED FILL — Fluticasone Propionate Nasal Susp 50 MCG/ACT: NASAL | 90 days supply | Qty: 48 | Fill #0 | Status: AC

## 2020-12-11 MED FILL — Atenolol Tab 50 MG: ORAL | 90 days supply | Qty: 90 | Fill #0 | Status: AC

## 2020-12-11 MED FILL — Rosuvastatin Calcium Tab 40 MG: ORAL | 90 days supply | Qty: 90 | Fill #0 | Status: AC

## 2020-12-12 ENCOUNTER — Other Ambulatory Visit (HOSPITAL_COMMUNITY): Payer: Self-pay

## 2020-12-26 ENCOUNTER — Other Ambulatory Visit: Payer: Self-pay | Admitting: Family Medicine

## 2020-12-26 DIAGNOSIS — E291 Testicular hypofunction: Secondary | ICD-10-CM

## 2020-12-26 DIAGNOSIS — R972 Elevated prostate specific antigen [PSA]: Secondary | ICD-10-CM

## 2021-01-31 ENCOUNTER — Other Ambulatory Visit (HOSPITAL_COMMUNITY): Payer: Self-pay

## 2021-01-31 MED FILL — Butalbital-Acetaminophen-Caffeine Tab 50-325-40 MG: ORAL | 45 days supply | Qty: 45 | Fill #0 | Status: AC

## 2021-02-19 ENCOUNTER — Other Ambulatory Visit: Payer: Self-pay

## 2021-02-19 ENCOUNTER — Other Ambulatory Visit: Payer: 59

## 2021-02-19 DIAGNOSIS — R972 Elevated prostate specific antigen [PSA]: Secondary | ICD-10-CM | POA: Diagnosis not present

## 2021-02-19 DIAGNOSIS — E291 Testicular hypofunction: Secondary | ICD-10-CM

## 2021-02-20 LAB — TESTOSTERONE: Testosterone: 134 ng/dL — ABNORMAL LOW (ref 264–916)

## 2021-02-20 LAB — HEMATOCRIT: Hematocrit: 47 % (ref 37.5–51.0)

## 2021-02-20 LAB — PSA: Prostate Specific Ag, Serum: 0.8 ng/mL (ref 0.0–4.0)

## 2021-02-22 ENCOUNTER — Encounter: Payer: Self-pay | Admitting: Urology

## 2021-02-22 ENCOUNTER — Ambulatory Visit: Payer: Self-pay | Admitting: Urology

## 2021-02-22 ENCOUNTER — Other Ambulatory Visit: Payer: Self-pay

## 2021-02-22 ENCOUNTER — Ambulatory Visit: Payer: 59 | Admitting: Urology

## 2021-02-22 VITALS — BP 137/78 | HR 67 | Ht 67.0 in | Wt 200.0 lb

## 2021-02-22 DIAGNOSIS — N5201 Erectile dysfunction due to arterial insufficiency: Secondary | ICD-10-CM | POA: Diagnosis not present

## 2021-02-22 DIAGNOSIS — E291 Testicular hypofunction: Secondary | ICD-10-CM

## 2021-02-22 NOTE — Progress Notes (Signed)
02/22/2021 10:09 PM   Jackson Vaughan [birth year] [id number]  Referring provider: Shon Baton, MD [address] [place],  [place] [address]  Chief Complaint  Patient presents with   Hypogonadism    Urologic history: 1.  Hypogonadism -Diagnosed 08/2017 -Started TRT January 2019 -Decreased libido, tiredness, fatigue   2.  Erectile dysfunction -Multiple organic risk factors -Flushing with sildenafil -Trial tadalafil 02/2019   HPI: 60 y.o. male presents for annual follow-up.  Remains on TRT and states his energy level and libido are good Interim lab work 08/2020 remarkable for testosterone level 748 and hematocrit 45.2 Labs were drawn on 02/19/2021 however he states he was 1 week overdue for an injection Testosterone level was 134 ng/dL; PSA 0.8 and hematocrit 47.0 No significant change in ED No bothersome LUTS   PMH: Past Medical History:  Diagnosis Date   GERD (gastroesophageal reflux disease)    Hypertension    Migraine headache    2x/month   Seasonal allergies    Vertigo    1 episode, approx 20 yrs ago    Surgical History: Past Surgical History:  Procedure Laterality Date   COLONOSCOPY WITH PROPOFOL N/A 07/10/2020   Procedure: COLONOSCOPY WITH BIOPSY;  Surgeon: Virgel Manifold, MD;  Location: [place];  Service: Endoscopy;  Laterality: N/A;   HERNIA REPAIR     POLYPECTOMY N/A 07/10/2020   Procedure: POLYPECTOMY;  Surgeon: Virgel Manifold, MD;  Location: [place];  Service: Endoscopy;  Laterality: N/A;   RHINOPLASTY      Home Medications:  Allergies as of 02/22/2021   No Known Allergies      Medication List        Accurate as of February 22, 2021 10:09 PM. If you have any questions, ask your nurse or doctor.          ATENOLOL PO Take by mouth.   atenolol 50 MG tablet Commonly known as: TENORMIN TAKE 1 TABLET BY MOUTH ONCE DAILY   butalbital-acetaminophen-caffeine 50-325-40 MG tablet Commonly known as:  FIORICET   butalbital-acetaminophen-caffeine 50-325-40 MG tablet Commonly known as: FIORICET TAKE 1 TABLET BY MOUTH ONCE DAILY AS NEEDED FOR SEVERE MIGRAINE   CRESTOR PO Take by mouth.   rosuvastatin 40 MG tablet Commonly known as: CRESTOR TAKE 1 TABLET BY MOUTH ONCE DAILY   fenofibrate 160 MG tablet Take 160 mg by mouth daily.   fenofibrate 160 MG tablet TAKE 1 TABLET BY MOUTH ONCE DAILY.   FISH OIL PO Take by mouth.   fluticasone 50 MCG/ACT nasal spray Commonly known as: FLONASE   fluticasone 50 MCG/ACT nasal spray Commonly known as: FLONASE USE 2 SPRAYS IN EACH NOSTRIL DAILY   pantoprazole 40 MG tablet Commonly known as: PROTONIX Take 40 mg by mouth daily.   pantoprazole 40 MG tablet Commonly known as: PROTONIX TAKE 1 TABLET BY MOUTH ONCE DAILY.   Suprep Bowel Prep Kit 17.5-3.13-1.6 GM/177ML Soln Generic drug: Na Sulfate-K Sulfate-Mg Sulf USE AS DIRECTED   tadalafil 20 MG tablet Commonly known as: CIALIS 1 tablet by mouth 30-60 minutes prior to intercourse   testosterone cypionate 200 MG/ML injection Commonly known as: DEPOTESTOSTERONE CYPIONATE INJECT 1 ML (200 MG TOTAL) INTO THE MUSCLE EVERY 14 DAYS.   vitamin C 500 MG tablet Commonly known as: ASCORBIC ACID Take 500 mg daily by mouth.   vitamin E 180 MG (400 UNITS) capsule Take 400 Units daily by mouth.        Allergies: No Known Allergies  Family History: Family  History  Problem Relation Age of Onset   Kidney cancer Neg Hx    Kidney disease Neg Hx    Prostate cancer Neg Hx     Social History:  reports that he quit smoking about 17 years ago. He has never used smokeless tobacco. He reports that he does not drink alcohol and does not use drugs.   Physical Exam: BP 137/78   Pulse 67   Ht _0  (1.702 m)   Wt 200 lb (90.7 kg)   BMI 31.32 kg/m   Constitutional:  Alert and oriented, No acute distress. HEENT: [place] AT, moist mucus membranes.  Trachea midline, no masses. Cardiovascular: No  clubbing, cyanosis, or edema. Respiratory: Normal respiratory effort, no increased work of breathing. Skin: No rashes, bruises or suspicious lesions. Neurologic: Grossly intact, no focal deficits, moving all 4 extremities. Psychiatric: Normal mood and affect.   Assessment & Plan:    1. Hypogonadism in male Stable on TRT Most recent level was low however he was overdue for an injection Continue annual visit for PSA, testosterone, hematocrit DRE next year Lab visit 12 months for testosterone/hematocrit  2.  Erectile dysfunction No improvement on PDE 5 inhibitors Not interested in second line options   [name], MD  [place] [address], [place], [place] [address] [phone]

## 2021-03-12 MED FILL — Atenolol Tab 50 MG: ORAL | 90 days supply | Qty: 90 | Fill #1 | Status: AC

## 2021-03-12 MED FILL — Fenofibrate Tab 160 MG: ORAL | 90 days supply | Qty: 90 | Fill #1 | Status: AC

## 2021-03-12 MED FILL — Rosuvastatin Calcium Tab 40 MG: ORAL | 90 days supply | Qty: 90 | Fill #1 | Status: AC

## 2021-03-12 MED FILL — Pantoprazole Sodium EC Tab 40 MG (Base Equiv): ORAL | 90 days supply | Qty: 90 | Fill #1 | Status: AC

## 2021-03-13 ENCOUNTER — Other Ambulatory Visit (HOSPITAL_COMMUNITY): Payer: Self-pay

## 2021-03-22 ENCOUNTER — Other Ambulatory Visit: Payer: Self-pay | Admitting: Urology

## 2021-03-22 ENCOUNTER — Other Ambulatory Visit (HOSPITAL_COMMUNITY): Payer: Self-pay

## 2021-03-22 MED ORDER — TESTOSTERONE CYPIONATE 200 MG/ML IM SOLN
INTRAMUSCULAR | 1 refills | Status: DC
  Filled 2021-03-22: qty 4, 56d supply, fill #0
  Filled 2021-08-23: qty 4, 56d supply, fill #1

## 2021-03-22 MED FILL — Fluticasone Propionate Nasal Susp 50 MCG/ACT: NASAL | 90 days supply | Qty: 48 | Fill #1 | Status: AC

## 2021-03-26 DIAGNOSIS — D2261 Melanocytic nevi of right upper limb, including shoulder: Secondary | ICD-10-CM | POA: Diagnosis not present

## 2021-03-26 DIAGNOSIS — D2271 Melanocytic nevi of right lower limb, including hip: Secondary | ICD-10-CM | POA: Diagnosis not present

## 2021-03-26 DIAGNOSIS — D2262 Melanocytic nevi of left upper limb, including shoulder: Secondary | ICD-10-CM | POA: Diagnosis not present

## 2021-03-26 DIAGNOSIS — L821 Other seborrheic keratosis: Secondary | ICD-10-CM | POA: Diagnosis not present

## 2021-03-26 DIAGNOSIS — D225 Melanocytic nevi of trunk: Secondary | ICD-10-CM | POA: Diagnosis not present

## 2021-03-26 DIAGNOSIS — D2272 Melanocytic nevi of left lower limb, including hip: Secondary | ICD-10-CM | POA: Diagnosis not present

## 2021-04-03 DIAGNOSIS — F5221 Male erectile disorder: Secondary | ICD-10-CM | POA: Diagnosis not present

## 2021-04-03 DIAGNOSIS — R6882 Decreased libido: Secondary | ICD-10-CM | POA: Diagnosis not present

## 2021-04-03 DIAGNOSIS — E291 Testicular hypofunction: Secondary | ICD-10-CM | POA: Diagnosis not present

## 2021-04-03 DIAGNOSIS — E669 Obesity, unspecified: Secondary | ICD-10-CM | POA: Diagnosis not present

## 2021-04-03 DIAGNOSIS — I10 Essential (primary) hypertension: Secondary | ICD-10-CM | POA: Diagnosis not present

## 2021-04-03 DIAGNOSIS — K219 Gastro-esophageal reflux disease without esophagitis: Secondary | ICD-10-CM | POA: Diagnosis not present

## 2021-04-03 DIAGNOSIS — R739 Hyperglycemia, unspecified: Secondary | ICD-10-CM | POA: Diagnosis not present

## 2021-04-03 DIAGNOSIS — R945 Abnormal results of liver function studies: Secondary | ICD-10-CM | POA: Diagnosis not present

## 2021-04-03 DIAGNOSIS — E785 Hyperlipidemia, unspecified: Secondary | ICD-10-CM | POA: Diagnosis not present

## 2021-04-22 MED FILL — Butalbital-Acetaminophen-Caffeine Tab 50-325-40 MG: ORAL | 45 days supply | Qty: 45 | Fill #1 | Status: AC

## 2021-04-23 ENCOUNTER — Other Ambulatory Visit (HOSPITAL_COMMUNITY): Payer: Self-pay

## 2021-06-03 ENCOUNTER — Other Ambulatory Visit (HOSPITAL_COMMUNITY): Payer: Self-pay

## 2021-06-04 ENCOUNTER — Other Ambulatory Visit (HOSPITAL_COMMUNITY): Payer: Self-pay

## 2021-06-05 ENCOUNTER — Other Ambulatory Visit (HOSPITAL_COMMUNITY): Payer: Self-pay

## 2021-06-05 MED ORDER — FENOFIBRATE 160 MG PO TABS
160.0000 mg | ORAL_TABLET | Freq: Every day | ORAL | 3 refills | Status: DC
  Filled 2021-06-05: qty 90, 90d supply, fill #0
  Filled 2021-09-11: qty 90, 90d supply, fill #1
  Filled 2021-12-12: qty 90, 90d supply, fill #2
  Filled 2022-03-05: qty 90, 90d supply, fill #3

## 2021-06-05 MED ORDER — ROSUVASTATIN CALCIUM 40 MG PO TABS
40.0000 mg | ORAL_TABLET | Freq: Every day | ORAL | 3 refills | Status: DC
  Filled 2021-06-05: qty 90, 90d supply, fill #0
  Filled 2021-09-11: qty 90, 90d supply, fill #1
  Filled 2021-12-12: qty 90, 90d supply, fill #2
  Filled 2022-03-05: qty 90, 90d supply, fill #3

## 2021-06-05 MED ORDER — PANTOPRAZOLE SODIUM 40 MG PO TBEC
40.0000 mg | DELAYED_RELEASE_TABLET | Freq: Every day | ORAL | 3 refills | Status: DC
  Filled 2021-06-05: qty 90, 90d supply, fill #0
  Filled 2021-09-11: qty 90, 90d supply, fill #1
  Filled 2021-12-12: qty 90, 90d supply, fill #2

## 2021-06-05 MED ORDER — ATENOLOL 50 MG PO TABS
50.0000 mg | ORAL_TABLET | Freq: Every day | ORAL | 3 refills | Status: DC
  Filled 2021-06-05: qty 90, 90d supply, fill #0
  Filled 2021-09-11: qty 90, 90d supply, fill #1
  Filled 2021-12-12: qty 90, 90d supply, fill #2
  Filled 2022-03-05: qty 90, 90d supply, fill #3

## 2021-07-12 ENCOUNTER — Other Ambulatory Visit (HOSPITAL_COMMUNITY): Payer: Self-pay

## 2021-07-16 ENCOUNTER — Other Ambulatory Visit (HOSPITAL_COMMUNITY): Payer: Self-pay

## 2021-07-18 ENCOUNTER — Other Ambulatory Visit (HOSPITAL_COMMUNITY): Payer: Self-pay

## 2021-07-19 ENCOUNTER — Other Ambulatory Visit (HOSPITAL_COMMUNITY): Payer: Self-pay

## 2021-07-19 MED ORDER — FLUTICASONE PROPIONATE 50 MCG/ACT NA SUSP
2.0000 | Freq: Every day | NASAL | 3 refills | Status: DC
  Filled 2021-07-19: qty 48, 90d supply, fill #0
  Filled 2021-11-14: qty 48, 90d supply, fill #1
  Filled 2022-03-06: qty 48, 90d supply, fill #2
  Filled 2022-07-08: qty 48, 90d supply, fill #3

## 2021-07-29 MED FILL — Butalbital-Acetaminophen-Caffeine Tab 50-325-40 MG: ORAL | 45 days supply | Qty: 45 | Fill #2 | Status: AC

## 2021-07-30 ENCOUNTER — Other Ambulatory Visit (HOSPITAL_COMMUNITY): Payer: Self-pay

## 2021-08-21 ENCOUNTER — Encounter: Payer: Self-pay | Admitting: Urology

## 2021-08-23 ENCOUNTER — Other Ambulatory Visit (HOSPITAL_COMMUNITY): Payer: Self-pay

## 2021-08-24 ENCOUNTER — Other Ambulatory Visit: Payer: Self-pay

## 2021-08-24 ENCOUNTER — Other Ambulatory Visit: Payer: 59

## 2021-08-24 DIAGNOSIS — E291 Testicular hypofunction: Secondary | ICD-10-CM | POA: Diagnosis not present

## 2021-08-25 ENCOUNTER — Encounter: Payer: Self-pay | Admitting: Urology

## 2021-08-25 LAB — HEMATOCRIT: Hematocrit: 48.8 % (ref 37.5–51.0)

## 2021-08-25 LAB — TESTOSTERONE: Testosterone: 561 ng/dL (ref 264–916)

## 2021-09-12 ENCOUNTER — Other Ambulatory Visit (HOSPITAL_COMMUNITY): Payer: Self-pay

## 2021-10-04 DIAGNOSIS — R739 Hyperglycemia, unspecified: Secondary | ICD-10-CM | POA: Diagnosis not present

## 2021-10-04 DIAGNOSIS — I10 Essential (primary) hypertension: Secondary | ICD-10-CM | POA: Diagnosis not present

## 2021-10-04 DIAGNOSIS — Z125 Encounter for screening for malignant neoplasm of prostate: Secondary | ICD-10-CM | POA: Diagnosis not present

## 2021-10-04 DIAGNOSIS — E785 Hyperlipidemia, unspecified: Secondary | ICD-10-CM | POA: Diagnosis not present

## 2021-10-04 DIAGNOSIS — E291 Testicular hypofunction: Secondary | ICD-10-CM | POA: Diagnosis not present

## 2021-10-11 DIAGNOSIS — R6882 Decreased libido: Secondary | ICD-10-CM | POA: Diagnosis not present

## 2021-10-11 DIAGNOSIS — R82998 Other abnormal findings in urine: Secondary | ICD-10-CM | POA: Diagnosis not present

## 2021-10-11 DIAGNOSIS — I10 Essential (primary) hypertension: Secondary | ICD-10-CM | POA: Diagnosis not present

## 2021-10-11 DIAGNOSIS — Z1331 Encounter for screening for depression: Secondary | ICD-10-CM | POA: Diagnosis not present

## 2021-10-11 DIAGNOSIS — E871 Hypo-osmolality and hyponatremia: Secondary | ICD-10-CM | POA: Diagnosis not present

## 2021-10-11 DIAGNOSIS — E669 Obesity, unspecified: Secondary | ICD-10-CM | POA: Diagnosis not present

## 2021-10-11 DIAGNOSIS — E785 Hyperlipidemia, unspecified: Secondary | ICD-10-CM | POA: Diagnosis not present

## 2021-10-11 DIAGNOSIS — R739 Hyperglycemia, unspecified: Secondary | ICD-10-CM | POA: Diagnosis not present

## 2021-10-11 DIAGNOSIS — R7401 Elevation of levels of liver transaminase levels: Secondary | ICD-10-CM | POA: Diagnosis not present

## 2021-10-11 DIAGNOSIS — Z1339 Encounter for screening examination for other mental health and behavioral disorders: Secondary | ICD-10-CM | POA: Diagnosis not present

## 2021-10-11 DIAGNOSIS — R945 Abnormal results of liver function studies: Secondary | ICD-10-CM | POA: Diagnosis not present

## 2021-10-11 DIAGNOSIS — Z0000 Encounter for general adult medical examination without abnormal findings: Secondary | ICD-10-CM | POA: Diagnosis not present

## 2021-11-06 DIAGNOSIS — H1045 Other chronic allergic conjunctivitis: Secondary | ICD-10-CM | POA: Diagnosis not present

## 2021-11-06 DIAGNOSIS — H5203 Hypermetropia, bilateral: Secondary | ICD-10-CM | POA: Diagnosis not present

## 2021-11-15 ENCOUNTER — Other Ambulatory Visit (HOSPITAL_COMMUNITY): Payer: Self-pay

## 2021-12-02 ENCOUNTER — Other Ambulatory Visit (HOSPITAL_COMMUNITY): Payer: Self-pay

## 2021-12-04 ENCOUNTER — Other Ambulatory Visit (HOSPITAL_COMMUNITY): Payer: Self-pay

## 2021-12-05 ENCOUNTER — Other Ambulatory Visit (HOSPITAL_COMMUNITY): Payer: Self-pay

## 2021-12-06 ENCOUNTER — Other Ambulatory Visit (HOSPITAL_COMMUNITY): Payer: Self-pay

## 2021-12-07 ENCOUNTER — Other Ambulatory Visit (HOSPITAL_COMMUNITY): Payer: Self-pay

## 2021-12-10 ENCOUNTER — Other Ambulatory Visit (HOSPITAL_COMMUNITY): Payer: Self-pay

## 2021-12-10 MED ORDER — BUTALBITAL-APAP-CAFFEINE 50-325-40 MG PO TABS
1.0000 | ORAL_TABLET | Freq: Every day | ORAL | 3 refills | Status: DC | PRN
  Filled 2021-12-10: qty 45, 45d supply, fill #0
  Filled 2022-03-05: qty 45, 45d supply, fill #1

## 2021-12-12 ENCOUNTER — Other Ambulatory Visit (HOSPITAL_COMMUNITY): Payer: Self-pay

## 2021-12-25 ENCOUNTER — Other Ambulatory Visit: Payer: Self-pay | Admitting: Urology

## 2021-12-26 ENCOUNTER — Other Ambulatory Visit (HOSPITAL_COMMUNITY): Payer: Self-pay

## 2021-12-27 ENCOUNTER — Other Ambulatory Visit (HOSPITAL_COMMUNITY): Payer: Self-pay

## 2021-12-27 ENCOUNTER — Other Ambulatory Visit: Payer: Self-pay | Admitting: Urology

## 2021-12-27 ENCOUNTER — Encounter: Payer: Self-pay | Admitting: Urology

## 2021-12-27 MED ORDER — TESTOSTERONE CYPIONATE 200 MG/ML IM SOLN
200.0000 mg | INTRAMUSCULAR | 0 refills | Status: DC
  Filled 2021-12-27: qty 6, 84d supply, fill #0

## 2022-01-16 ENCOUNTER — Encounter: Payer: Self-pay | Admitting: Urology

## 2022-01-23 ENCOUNTER — Emergency Department: Payer: 59

## 2022-01-23 ENCOUNTER — Emergency Department
Admission: EM | Admit: 2022-01-23 | Discharge: 2022-01-23 | Disposition: A | Discharge status: Home / Self Care | Payer: 59 | Attending: Student in an Organized Health Care Education/Training Program | Admitting: Student in an Organized Health Care Education/Training Program

## 2022-01-23 ENCOUNTER — Other Ambulatory Visit: Payer: Self-pay

## 2022-01-23 DIAGNOSIS — R6883 Chills (without fever): Secondary | ICD-10-CM | POA: Insufficient documentation

## 2022-01-23 DIAGNOSIS — E872 Acidosis, unspecified: Secondary | ICD-10-CM | POA: Diagnosis not present

## 2022-01-23 DIAGNOSIS — R109 Unspecified abdominal pain: Secondary | ICD-10-CM | POA: Diagnosis not present

## 2022-01-23 DIAGNOSIS — R1032 Left lower quadrant pain: Secondary | ICD-10-CM | POA: Diagnosis not present

## 2022-01-23 DIAGNOSIS — K5732 Diverticulitis of large intestine without perforation or abscess without bleeding: Secondary | ICD-10-CM | POA: Insufficient documentation

## 2022-01-23 LAB — URINALYSIS, ROUTINE W REFLEX MICROSCOPIC
Bilirubin Urine: NEGATIVE
Glucose, UA: NEGATIVE mg/dL
Hgb urine dipstick: NEGATIVE
Ketones, ur: NEGATIVE mg/dL
Leukocytes,Ua: NEGATIVE
Nitrite: NEGATIVE
Protein, ur: NEGATIVE mg/dL
Specific Gravity, Urine: >1.046 — ABNORMAL HIGH (ref 1.005–1.030)
pH: 7 (ref 5.0–8.0)

## 2022-01-23 LAB — COMPREHENSIVE METABOLIC PANEL
ALT: 37 U/L (ref 0–44)
AST: 39 U/L (ref 15–41)
Albumin: 4.3 g/dL (ref 3.5–5.0)
Alkaline Phosphatase: 50 U/L (ref 38–126)
Anion gap: 8 (ref 5–15)
BUN: 21 mg/dL (ref 8–23)
CO2: 24 mmol/L (ref 22–32)
Calcium: 9.4 mg/dL (ref 8.9–10.3)
Chloride: 107 mmol/L (ref 98–111)
Creatinine, Ser: 0.88 mg/dL (ref 0.61–1.24)
GFR, Estimated: >60 mL/min (ref >60)
Glucose, Bld: 121 mg/dL — ABNORMAL HIGH (ref 70–99)
Potassium: 4.2 mmol/L (ref 3.5–5.1)
Sodium: 139 mmol/L (ref 135–145)
Total Bilirubin: 0.9 mg/dL (ref 0.3–1.2)
Total Protein: 7.7 g/dL (ref 6.5–8.1)

## 2022-01-23 LAB — CBC
HCT: 48.7 % (ref 39.0–52.0)
Hemoglobin: 16 g/dL (ref 13.0–17.0)
MCH: 29.9 pg (ref 26.0–34.0)
MCHC: 32.9 g/dL (ref 30.0–36.0)
MCV: 91 fL (ref 80.0–100.0)
Platelets: 297 10*3/uL (ref 150–400)
RBC: 5.35 MIL/uL (ref 4.22–5.81)
RDW: 13.2 % (ref 11.5–15.5)
WBC: 15.7 10*3/uL — ABNORMAL HIGH (ref 4.0–10.5)
nRBC: 0 % (ref 0.0–0.2)

## 2022-01-23 LAB — LACTIC ACID, PLASMA
Lactic Acid, Venous: 2.2 mmol/L (ref 0.5–1.9)
Lactic Acid, Venous: 2.3 mmol/L (ref 0.5–1.9)
Lactic Acid, Venous: 1.4 mmol/L (ref 0.5–1.9)

## 2022-01-23 MED ORDER — OXYCODONE-ACETAMINOPHEN 5-325 MG PO TABS
1.0000 | ORAL_TABLET | ORAL | 0 refills | Status: DC | PRN
  Filled 2022-01-23: qty 10, 2d supply, fill #0

## 2022-01-23 MED ORDER — SODIUM CHLORIDE 0.9 % IV BOLUS
500.0000 mL | Freq: Once | INTRAVENOUS | Status: AC
  Administered 2022-01-23: 500 mL via INTRAVENOUS

## 2022-01-23 MED ORDER — SODIUM CHLORIDE 0.9 % IV SOLN
2.0000 g | Freq: Once | INTRAVENOUS | Status: AC
  Administered 2022-01-23: 2 g via INTRAVENOUS
  Filled 2022-01-23: qty 20

## 2022-01-23 MED ORDER — ONDANSETRON HCL 4 MG/2ML IJ SOLN
4.0000 mg | Freq: Once | INTRAMUSCULAR | Status: AC
  Administered 2022-01-23: 4 mg via INTRAVENOUS
  Filled 2022-01-23: qty 2

## 2022-01-23 MED ORDER — MORPHINE SULFATE (PF) 4 MG/ML IV SOLN
4.0000 mg | INTRAVENOUS | Status: DC | PRN
  Administered 2022-01-23: 4 mg via INTRAVENOUS
  Filled 2022-01-23: qty 1

## 2022-01-23 MED ORDER — AMOXICILLIN-POT CLAVULANATE 875-125 MG PO TABS
1.0000 | ORAL_TABLET | Freq: Two times a day (BID) | ORAL | 0 refills | Status: AC
  Filled 2022-01-23: qty 20, 10d supply, fill #0

## 2022-01-23 MED ORDER — IOHEXOL 300 MG/ML  SOLN
100.0000 mL | Freq: Once | INTRAMUSCULAR | Status: AC | PRN
  Administered 2022-01-23: 100 mL via INTRAVENOUS

## 2022-01-23 MED ORDER — KETOROLAC TROMETHAMINE 30 MG/ML IJ SOLN
15.0000 mg | Freq: Once | INTRAMUSCULAR | Status: AC
  Administered 2022-01-23: 15 mg via INTRAVENOUS
  Filled 2022-01-23: qty 1

## 2022-01-23 MED ORDER — METRONIDAZOLE 500 MG/100ML IV SOLN
500.0000 mg | Freq: Once | INTRAVENOUS | Status: AC
  Administered 2022-01-23: 500 mg via INTRAVENOUS
  Filled 2022-01-23: qty 100

## 2022-01-23 MED ORDER — ONDANSETRON 4 MG PO TBDP
4.0000 mg | ORAL_TABLET | Freq: Three times a day (TID) | ORAL | 0 refills | Status: DC | PRN
  Filled 2022-01-23: qty 10, 4d supply, fill #0

## 2022-01-23 NOTE — ED Triage Notes (Signed)
Pt c/o LLQ pain since last night , denies N/V/D. Denies painful urination.  ?

## 2022-01-23 NOTE — ED Notes (Signed)
Signature pad not working, verbal consent for discharge obtained. ?

## 2022-01-23 NOTE — ED Notes (Signed)
Date and time results received: 01/23/22 0814 ?(use smartphrase ".now" to insert current time) ? ?Test: lactic acid ?Critical Value: 2.2 ? ?Name of Provider Notified: robinson ? ?Orders Received? Or Actions Taken?: will continue to monitor ?

## 2022-01-23 NOTE — ED Provider Notes (Signed)
? ?[facility] ?Provider Note ? ? ? Event Date/Time  ? First MD Initiated Contact with Patient 01/23/22 [phone]   ?  (approximate) ? ? ?History  ? ?Abdominal Pain ? ? ?HPI ? ?Jackson Vaughan is a 61 y.o. male with remote history of kidney stone presents to the ER for evaluation of acute left lower quadrant abdominal pain moderate to severe in nature has had some chills no measured temperature.  Denies any chest pain or shortness of breath.  No diarrhea no dysuria.  Has never had pain quite like this before.  Denies any recent abdominal surgeries. ?  ? ? ?Physical Exam  ? ?Triage Vital Signs: ?ED Triage Vitals [01/23/22 0726]  ?Enc Vitals Group  ?   BP   ?   Pulse   ?   Resp   ?   Temp   ?   Temp src   ?   SpO2   ?   Weight   ?   Height   ?   Head Circumference   ?   Peak Flow   ?   Pain Score 4  ?   Pain Loc   ?   Pain Edu?   ?   Excl. in [place]?   ? ? ?Most recent vital signs: ?Vitals:  ? 01/23/22 1015 01/23/22 1030  ?BP:  125/80  ?Pulse: 88 88  ?Resp: 19 13  ?Temp:    ?SpO2: 97% 96%  ? ? ? ?Constitutional: Alert  ?Eyes: Conjunctivae are normal.  ?Head: Atraumatic. ?Nose: No congestion/rhinnorhea. ?Mouth/Throat: Mucous membranes are moist.   ?Neck: Painless ROM.  ?Cardiovascular:   Good peripheral circulation. ?Respiratory: Normal respiratory effort.  No retractions.  ?Gastrointestinal: Soft with tenderness palpation left lower quadrant no guarding or rebound. ?Musculoskeletal:  no deformity ?Neurologic:  MAE spontaneously. No gross focal neurologic deficits are appreciated.  ?Skin:  Skin is warm, dry and intact. No rash noted. ?Psychiatric: Mood and affect are normal. Speech and behavior are normal. ? ? ? ?ED Results / Procedures / Treatments  ? ?Labs ?(all labs ordered are listed, but only abnormal results are displayed) ?Labs Reviewed  ?CBC - Abnormal; Notable for the following components:  ?    Result Value  ? WBC 15.7 (*)   ? All other components within normal limits  ?COMPREHENSIVE METABOLIC  PANEL - Abnormal; Notable for the following components:  ? Glucose, Bld 121 (*)   ? All other components within normal limits  ?LACTIC ACID, PLASMA - Abnormal; Notable for the following components:  ? Lactic Acid, Venous 2.2 (*)   ? All other components within normal limits  ?LACTIC ACID, PLASMA - Abnormal; Notable for the following components:  ? Lactic Acid, Venous 2.3 (*)   ? All other components within normal limits  ?URINALYSIS, ROUTINE W REFLEX MICROSCOPIC - Abnormal; Notable for the following components:  ? Color, Urine YELLOW (*)   ? APPearance CLEAR (*)   ? Specific Gravity, Urine >1.046 (*)   ? All other components within normal limits  ?LACTIC ACID, PLASMA  ?LACTIC ACID, PLASMA  ? ? ? ?EKG ? ?ED ECG REPORT ?I, Merlyn Lot, the attending physician, personally viewed and interpreted this ECG. ? ? Date: 01/23/2022 ? EKG Time: 7:39 ? Rate: 90 ? Rhythm: sinus ? Axis: normal ? Intervals:normal ? ST&T Change: no stemi, no depression ? ? ? ?RADIOLOGY ?Please see ED Course for my review and interpretation. ? ?I personally reviewed all radiographic images ordered to evaluate  for the above acute complaints and reviewed radiology reports and findings.  These findings were personally discussed with the patient.  Please see medical record for radiology report. ? ? ? ?PROCEDURES: ? ?Critical Care performed: No ? ?Procedures ? ? ?MEDICATIONS ORDERED IN ED: ?Medications  ?morphine (PF) 4 MG/ML injection 4 mg (4 mg Intravenous Given 01/23/22 0750)  ?sodium chloride 0.9 % bolus 500 mL (0 mLs Intravenous Stopped 01/23/22 0853)  ?ondansetron [facility]) injection 4 mg (4 mg Intravenous Given 01/23/22 0748)  ?iohexol (OMNIPAQUE) 300 MG/ML solution 100 mL (100 mLs Intravenous Contrast Given 01/23/22 0833)  ?cefTRIAXone (ROCEPHIN) 2 g in sodium chloride 0.9 % 100 mL IVPB (0 g Intravenous Stopped 01/23/22 0946)  ?metroNIDAZOLE (FLAGYL) IVPB 500 mg (0 mg Intravenous Stopped 01/23/22 1046)  ?ketorolac (TORADOL) 30 MG/ML injection 15 mg  (15 mg Intravenous Given 01/23/22 0902)  ?sodium chloride 0.9 % bolus 500 mL (0 mLs Intravenous Stopped 01/23/22 0921)  ?sodium chloride 0.9 % bolus 500 mL (0 mLs Intravenous Stopped 01/23/22 1132)  ? ? ? ?IMPRESSION / MDM / ASSESSMENT AND PLAN / ED COURSE  ?I reviewed the triage vital signs and the nursing notes. ?             ?               ? ?Differential diagnosis includes, but is not limited to, diverticulitis, colitis, enteritis, stone, SBO, perforation, abscess, ? ? ?Patient presenting to the ER for evaluation of abdominal pain as described above.  Somewhat uncomfortable.  Will give IV morphine as well as IV fluids IV antiemetic.  This presenting complaint could reflect a potentially life-threatening illness therefore the patient will be placed on continuous pulse oximetry and telemetry for monitoring.  Laboratory evaluation will be sent to evaluate for the above complaints.   ? ? ? ?Clinical Course as of 01/23/22 1225  ?Wed Jan 23, 2022  ?0819 Patient's lactate is 2.2. [PR]  ?0820 Chest x-ray my interpretation does not show any evidence of free air under the diaphragm. [PR]  ?[phone] CT abdpelv on my interpretation shows no AAA, suspect diverticulitis, will await formal radiology report. [PR]  ?0856 Imaging consistent with diverticulitis.  No perforation or abscess.  Patient requesting something to eat.  Have considered admission to the hospital but first will p.o. challenge will give IV fluids will give dose of IV antibiotics and observe here in the ER.  If tolerating p.o. repeat lactate normalizes remains afebrile hemodynamically stable do feel to be appropriate for outpatient follow-up. [PR]  ?1038 Repeat lactate equivocal.  His suspicion that was done in lab for prolonged period therefore will repeat.  Vital signs are normalizing.  Patient is dehydrated based on urinalysis therefore will order additional IV bolus and repeat lactate [PR]  ?[place].  Repeat lactic acid is normal.  His pain is  controlled.  Discussed option for observation the hospital for additional IV hydration or monitoring versus trial of outpatient management.  Patient feels comfortable trial of outpatient management he is tolerating p.o.  At this point do believe he stable and appropriate for discharge.  We discussed strict return precautions.  Patient agreeable to plan. [PR]  ?  ?Clinical Course User Index ?[PR] Merlyn Lot, MD  ? ? ? ?FINAL CLINICAL IMPRESSION(S) / ED DIAGNOSES  ? ?Final diagnoses:  ?Diverticulitis of large intestine without perforation or abscess without bleeding  ? ? ? ?Rx / DC Orders  ? ?ED Discharge Orders   ? ?  Ordered  ?  amoxicillin-clavulanate (AUGMENTIN) 875-125 MG tablet  2 times daily       ? 01/23/22 1224  ?  oxyCODONE-acetaminophen (PERCOCET) 5-325 MG tablet  Every 4 hours PRN       ? 01/23/22 1224  ?  ondansetron (ZOFRAN-ODT) 4 MG disintegrating tablet  Every 8 hours PRN       ? 01/23/22 1224  ? ?  ?  ? ?  ? ? ? ?Note:  This document was prepared using Dragon voice recognition software and may include unintentional dictation errors. ? ?  ?Merlyn Lot, MD ?01/23/22 1456 ? ?

## 2022-01-30 ENCOUNTER — Encounter: Payer: Self-pay | Admitting: Surgery

## 2022-01-30 ENCOUNTER — Ambulatory Visit: Payer: 59 | Admitting: Surgery

## 2022-01-30 VITALS — BP 146/84 | HR 72 | Temp 98.7°F | Ht 67.0 in | Wt 205.8 lb

## 2022-01-30 DIAGNOSIS — K5732 Diverticulitis of large intestine without perforation or abscess without bleeding: Secondary | ICD-10-CM | POA: Diagnosis not present

## 2022-01-30 DIAGNOSIS — Z86010 Personal history of colonic polyps: Secondary | ICD-10-CM | POA: Diagnosis not present

## 2022-01-30 NOTE — Progress Notes (Unsigned)
Patient ID: Jackson Vaughan, male   DOB: 07/23/1961, 61 y.o.   MRN: [id number]  HPI Jackson Vaughan is a 61 y.o. male seen for new onset of left lower quadrant pain that started last week.  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 Last week came to the emergency room because the pain was moderate to severe sharp and intermittent.  No specific alleviating or aggravating factors.  He did have a fever.  Work-up in the emergency room included a CT scan that I have personally reviewed showing evidence of diverticulitis without abscess without perforation. His CBC was 15,000 and the rest of the labs including a CMP were normal.  His lactate level was a little bit high and this trended nicely after hydration. He continues to have some pain to a lesser degree.  He had also some diarrhea that has improved over the last couple of days. No other fevers or chills.  He is eating okay.  No nausea no vomiting. This is the current episode of diverticulitis.  About 6 months ago or so he reports having a similar episode to a lesser degree. He had also a colonoscopy that I have personally reviewed from 2 years ago showing multiple polyps consistent with tubular adenomas.  There was also evidence of diverticulosis. He is under quite a bit of a stress at work   HPI  Past Medical History:  Diagnosis Date   GERD (gastroesophageal reflux disease)    Hypertension    Migraine headache    2x/month   Seasonal allergies    Vertigo    1 episode, approx 20 yrs ago    Past Surgical History:  Procedure Laterality Date   COLONOSCOPY WITH PROPOFOL N/A 07/10/2020   Procedure: COLONOSCOPY WITH BIOPSY;  Surgeon: Virgel Manifold, MD;  Location: [facility];  Service: Endoscopy;  Laterality: N/A;   HERNIA REPAIR     POLYPECTOMY N/A 07/10/2020   Procedure: POLYPECTOMY;  Surgeon: Virgel Manifold, MD;  Location: [facility];  Service: Endoscopy;  Laterality: N/A;   RHINOPLASTY      Family History   Problem Relation Age of Onset   Kidney cancer Neg Hx    Kidney disease Neg Hx    Prostate cancer Neg Hx     Social History Social History   Tobacco Use   Smoking status: Former    Types: Cigarettes    Quit date: 2005    Years since quitting: 18.4   Smokeless tobacco: Never   Tobacco comments:    off and on for 15 yrs  Vaping Use   Vaping Use: Never used  Substance Use Topics   Alcohol use: No    Comment: rare   Drug use: Never    No Known Allergies  Current Outpatient Medications  Medication Sig Dispense Refill   amoxicillin-clavulanate (AUGMENTIN) 875-125 MG tablet Take 1 tablet by mouth 2 (two) times daily for 10 days. 20 tablet 0   atenolol (TENORMIN) 50 MG tablet TAKE 1 TABLET BY MOUTH ONCE DAILY 90 tablet 3   atenolol (TENORMIN) 50 MG tablet Take 1 tablet (50 mg total) by mouth daily. 90 tablet 3   ATENOLOL PO Take by mouth.     butalbital-acetaminophen-caffeine (FIORICET) 50-325-40 MG tablet Take 1 tablet by mouth daily as needed for severe migraines 45 tablet 3   butalbital-acetaminophen-caffeine (FIORICET, ESGIC) 50-325-40 MG tablet      fenofibrate 160 MG tablet Take 160 mg by mouth daily.  3  fenofibrate 160 MG tablet Take 1 tablet (160 mg total) by mouth daily. 90 tablet 3   fluticasone (FLONASE) 50 MCG/ACT nasal spray Place 2 sprays into both nostrils daily. 48 g 3   Omega-3 Fatty Acids (FISH OIL PO) Take by mouth.     pantoprazole (PROTONIX) 40 MG tablet Take 40 mg by mouth daily.  2   rosuvastatin (CRESTOR) 40 MG tablet Take 1 tablet (40 mg total) by mouth daily. 90 tablet 3   tadalafil (CIALIS) 20 MG tablet 1 tablet by mouth 30-60 minutes prior to intercourse 30 tablet 1   testosterone cypionate (DEPOTESTOSTERONE CYPIONATE) 200 MG/ML injection Inject 1 mL (200 mg total) into the muscle every 14 (fourteen) days. 6 mL 0   vitamin C (ASCORBIC ACID) 500 MG tablet Take 500 mg daily by mouth.     vitamin E 400 UNIT capsule Take 400 Units daily by mouth.      No current facility-administered medications for this visit.     Review of Systems Full ROS  was asked and was negative except for the information on the HPI  Physical Exam Blood pressure (!) 146/84, pulse 72, temperature 98.7 F (37.1 C), temperature source Oral, height '5\' 7"'$  (1.702 m), weight 205 lb 12.8 oz (93.4 kg), SpO2 98 %. CONSTITUTIONAL: NAD. EYES: Pupils are equal, round,  Sclera are non-icteric. EARS, NOSE, MOUTH AND THROAT: The oropharynx is clear. The oral mucosa is pink and moist. Hearing is intact to voice. LYMPH NODES:  Lymph nodes in the neck are normal. RESPIRATORY:  Lungs are clear. There is normal respiratory effort, with equal breath sounds bilaterally, and without pathologic use of accessory muscles. CARDIOVASCULAR: Heart is regular without murmurs, gallops, or rubs. GI: The abdomen is  soft, very mild tenderness to palpation in the left flank and left lower quadrant without peritonitis, and nondistended. There are no palpable masses. There is no hepatosplenomegaly. There are normal bowel sounds  GU: Rectal deferred.   MUSCULOSKELETAL: Normal muscle strength and tone. No cyanosis or edema.   SKIN: Turgor is good and there are no pathologic skin lesions or ulcers. NEUROLOGIC: Motor and sensation is grossly normal. Cranial nerves are grossly intact. PSYCH:  Oriented to person, place and time. Affect is normal.  Data Reviewed  I have personally reviewed the patient's imaging, laboratory findings and medical records.    Assessment/Plan   61 year old male with second episode of diverticulitis.  At this time there is no evidence of complications there is no evidence of perforation or abscess.  Clinically improving.  I do not necessarily see a reason for further imaging or interventions at this time.  He and his wife know to call me if the symptoms do not improve.  If that would be the case we may have to repeat further imaging.  Also discussed about diet modifications  and continuation of schedule colonoscopies. There is no need for hospitalization or surgical intervention at this time   Please note that I spent  45 minutes in this encounter including coordination of his care, personally reviewing records, performing appropriate documentation and  placing orders  Caroleen Hamman, MD [facility] 01/30/2022, 3:34 PM

## 2022-01-30 NOTE — Patient Instructions (Addendum)
If you have any concerns or questions, please feel free to call our office. Follow up as needed.    Diverticulitis  Diverticulitis is when small pouches in your colon (large intestine) get infected or swollen. This causes pain in the belly (abdomen) and watery poop (diarrhea). These pouches are called diverticula. The pouches form in people who have a condition called diverticulosis. What are the causes? This condition may be caused by poop (stool) that gets trapped in the pouches in your colon. The poop lets germs (bacteria) grow in the pouches. This causes the infection. What increases the risk? You are more likely to get this condition if you have small pouches in your colon. The risk is higher if: You are overweight or very overweight (obese). You do not exercise enough. You drink alcohol. You smoke or use products with tobacco in them. You eat a diet that has a lot of red meat such as beef, pork, or lamb. You eat a diet that does not have enough fiber in it. You are older than 62 years of age. What are the signs or symptoms? Pain in the belly. Pain is often on the left side, but it may be in other areas. Fever and feeling cold. Feeling like you may vomit. Vomiting. Having cramps. Feeling full. Changes to how often you poop. Blood in your poop. How is this treated? Most cases are treated at home by: Taking over-the-counter pain medicines. Following a clear liquid diet. Taking antibiotic medicines. Resting. Very bad cases may need to be treated at a hospital. This may include: Not eating or drinking. Taking prescription pain medicine. Getting antibiotic medicines through an IV tube. Getting fluid and food through an IV tube. Having surgery. When you are feeling better, your doctor may tell you to have a test to check your colon (colonoscopy). Follow these instructions at home: Medicines Take over-the-counter and prescription medicines only as told by your doctor. These  include: Antibiotics. Pain medicines. Fiber pills. Probiotics. Stool softeners. If you were prescribed an antibiotic medicine, take it as told by your doctor. Do not stop taking the antibiotic even if you start to feel better. Ask your doctor if the medicine prescribed to you requires you to avoid driving or using machinery. Eating and drinking  Follow a diet as told by your doctor. When you feel better, your doctor may tell you to change your diet. You may need to eat a lot of fiber. Fiber makes it easier to poop (have a bowel movement). Foods with fiber include: Berries. Beans. Lentils. Green vegetables. Avoid eating red meat. General instructions Do not use any products that contain nicotine or tobacco, such as cigarettes, e-cigarettes, and chewing tobacco. If you need help quitting, ask your doctor. Exercise 3 or more times a week. Try to get 30 minutes each time. Exercise enough to sweat and make your heart beat faster. Keep all follow-up visits as told by your doctor. This is important. Contact a doctor if: Your pain does not get better. You are not pooping like normal. Get help right away if: Your pain gets worse. Your symptoms do not get better. Your symptoms get worse very fast. You have a fever. You vomit more than one time. You have poop that is: Bloody. Black. Tarry. Summary This condition happens when small pouches in your colon get infected or swollen. Take medicines only as told by your doctor. Follow a diet as told by your doctor. Keep all follow-up visits as told by your doctor.  This is important. This information is not intended to replace advice given to you by your health care provider. Make sure you discuss any questions you have with your health care provider. Document Revised: 06/07/2019 Document Reviewed: 06/07/2019 Elsevier Patient Education  [place].

## 2022-01-31 ENCOUNTER — Encounter: Payer: Self-pay | Admitting: Surgery

## 2022-02-06 ENCOUNTER — Ambulatory Visit: Payer: 59 | Admitting: Surgery

## 2022-02-22 ENCOUNTER — Other Ambulatory Visit: Payer: Self-pay

## 2022-02-25 ENCOUNTER — Other Ambulatory Visit: Payer: 59

## 2022-02-25 ENCOUNTER — Ambulatory Visit: Payer: Self-pay | Admitting: Urology

## 2022-02-27 ENCOUNTER — Ambulatory Visit: Payer: 59 | Admitting: Urology

## 2022-03-04 ENCOUNTER — Other Ambulatory Visit: Payer: Self-pay

## 2022-03-04 DIAGNOSIS — E291 Testicular hypofunction: Secondary | ICD-10-CM

## 2022-03-05 ENCOUNTER — Other Ambulatory Visit: Payer: 59

## 2022-03-05 DIAGNOSIS — E291 Testicular hypofunction: Secondary | ICD-10-CM

## 2022-03-06 ENCOUNTER — Other Ambulatory Visit (HOSPITAL_COMMUNITY): Payer: Self-pay

## 2022-03-06 LAB — TESTOSTERONE: Testosterone: 203 ng/dL — ABNORMAL LOW (ref 264–916)

## 2022-03-06 LAB — HEMATOCRIT: Hematocrit: 48.1 % (ref 37.5–51.0)

## 2022-03-06 MED ORDER — PANTOPRAZOLE SODIUM 40 MG PO TBEC
40.0000 mg | DELAYED_RELEASE_TABLET | Freq: Every day | ORAL | 4 refills | Status: DC
  Filled 2022-03-06: qty 90, 90d supply, fill #0
  Filled 2022-05-31: qty 90, 90d supply, fill #1
  Filled 2022-09-11: qty 90, 90d supply, fill #2
  Filled 2023-02-11: qty 90, 90d supply, fill #3

## 2022-03-06 MED ORDER — PANTOPRAZOLE SODIUM 40 MG PO TBEC: 40.0000 mg | DELAYED_RELEASE_TABLET | Freq: Every day | ORAL | 3 refills | Status: AC

## 2022-03-07 ENCOUNTER — Ambulatory Visit: Payer: 59 | Admitting: Urology

## 2022-03-07 ENCOUNTER — Encounter: Payer: Self-pay | Admitting: Urology

## 2022-03-07 VITALS — BP 162/74 | HR 67 | Ht 67.0 in | Wt 205.0 lb

## 2022-03-07 DIAGNOSIS — E291 Testicular hypofunction: Secondary | ICD-10-CM | POA: Diagnosis not present

## 2022-03-07 DIAGNOSIS — N529 Male erectile dysfunction, unspecified: Secondary | ICD-10-CM

## 2022-03-07 NOTE — Progress Notes (Signed)
03/07/2022 8:28 AM   Jackson Vaughan 12-26-1960 [id number]  Referring provider: Shon Baton, MD [address] [place],   [address]  Chief Complaint  Patient presents with   Hypogonadism    Urologic history: 1.  Hypogonadism -Diagnosed 08/2017 -Started TRT January 2019 -Decreased libido, tiredness, fatigue   2.  Erectile dysfunction -Multiple organic risk factors -Flushing with sildenafil -Trial tadalafil 02/2019   HPI: 61 y.o. male presents for annual follow-up.  Stable symptoms on TRT Interim lab work 08/2021 remarkable for testosterone level 561 and hematocrit 48.8 Labs 03/05/2022: Testosterone level was 203 ng/dL; hematocrit 48.1.  PSA was not drawn 3 weeks since his last injection at the time of this blood draw No significant change in ED Has noted occasional urgency   PMH: Past Medical History:  Diagnosis Date   GERD (gastroesophageal reflux disease)    Hypertension    Migraine headache    2x/month   Seasonal allergies    Vertigo    1 episode, approx 20 yrs ago    Surgical History: Past Surgical History:  Procedure Laterality Date   COLONOSCOPY WITH PROPOFOL N/A 07/10/2020   Procedure: COLONOSCOPY WITH BIOPSY;  Surgeon: Virgel Manifold, MD;  Location: [facility];  Service: Endoscopy;  Laterality: N/A;   HERNIA REPAIR     POLYPECTOMY N/A 07/10/2020   Procedure: POLYPECTOMY;  Surgeon: Virgel Manifold, MD;  Location: [facility];  Service: Endoscopy;  Laterality: N/A;   RHINOPLASTY      Home Medications:  Allergies as of 03/07/2022   No Known Allergies      Medication List        Accurate as of March 07, 2022  8:28 AM. If you have any questions, ask your nurse or doctor.          ATENOLOL PO Take by mouth.   atenolol 50 MG tablet Commonly known as: TENORMIN TAKE 1 TABLET BY MOUTH ONCE DAILY   atenolol 50 MG tablet Commonly known as: TENORMIN Take 1 tablet (50 mg total) by mouth daily.    butalbital-acetaminophen-caffeine 50-325-40 MG tablet Commonly known as: FIORICET   butalbital-acetaminophen-caffeine 50-325-40 MG tablet Commonly known as: FIORICET Take 1 tablet by mouth daily as needed for severe migraines   fenofibrate 160 MG tablet Take 160 mg by mouth daily.   fenofibrate 160 MG tablet Take 1 tablet (160 mg total) by mouth daily.   FISH OIL PO Take by mouth.   fluticasone 50 MCG/ACT nasal spray Commonly known as: FLONASE Place 2 sprays into both nostrils daily.   pantoprazole 40 MG tablet Commonly known as: PROTONIX Take 40 mg by mouth daily.   pantoprazole 40 MG tablet Commonly known as: PROTONIX TAKE 1 TABLET BY MOUTH ONCE DAILY.   pantoprazole 40 MG tablet Commonly known as: PROTONIX Take 1 tablet (40 mg total) by mouth daily.   rosuvastatin 40 MG tablet Commonly known as: CRESTOR Take 1 tablet (40 mg total) by mouth daily.   tadalafil 20 MG tablet Commonly known as: CIALIS 1 tablet by mouth 30-60 minutes prior to intercourse   testosterone cypionate 200 MG/ML injection Commonly known as: DEPOTESTOSTERONE CYPIONATE Inject 1 mL (200 mg total) into the muscle every 14 (fourteen) days.   vitamin C 500 MG tablet Commonly known as: ASCORBIC ACID Take 500 mg daily by mouth.   vitamin E 180 MG (400 UNITS) capsule Take 400 Units daily by mouth.        Allergies: No Known Allergies  Family History:  Family History  Problem Relation Age of Onset   Kidney cancer Neg Hx    Kidney disease Neg Hx    Prostate cancer Neg Hx     Social History:  reports that he quit smoking about 18 years ago. His smoking use included cigarettes. He has never used smokeless tobacco. He reports that he does not drink alcohol and does not use drugs.   Physical Exam: BP (!) 162/74   Pulse 67   Ht '5\' 7"'$  (1.702 m)   Wt 205 lb (93 kg)   BMI 32.11 kg/m   Constitutional:  Alert and oriented, No acute distress. HEENT: [place] AT, moist mucus membranes.  Trachea  midline, no masses. Cardiovascular: No clubbing, cyanosis, or edema. Respiratory: Normal respiratory effort, no increased work of breathing. GU: Prostate 40 g, smooth without nodules Psychiatric: Normal mood and affect.   Assessment & Plan:    1. Hypogonadism in male Stable on TRT Most recent level was low however he was overdue for an injection Continue annual visit for PSA, testosterone, hematocrit Lab visit 6 months for testosterone/hematocrit  2.  Erectile dysfunction No improvement on PDE 5 inhibitors Not interested in second line options   [name], MD  [facility] [address] [place], [place] [address] [phone]

## 2022-03-08 ENCOUNTER — Encounter: Payer: Self-pay | Admitting: Family Medicine

## 2022-03-08 LAB — PSA: Prostate Specific Ag, Serum: 1.5 ng/mL (ref 0.0–4.0)

## 2022-03-08 LAB — SPECIMEN STATUS REPORT

## 2022-03-26 DIAGNOSIS — L821 Other seborrheic keratosis: Secondary | ICD-10-CM | POA: Diagnosis not present

## 2022-03-26 DIAGNOSIS — D225 Melanocytic nevi of trunk: Secondary | ICD-10-CM | POA: Diagnosis not present

## 2022-03-26 DIAGNOSIS — D044 Carcinoma in situ of skin of scalp and neck: Secondary | ICD-10-CM | POA: Diagnosis not present

## 2022-03-26 DIAGNOSIS — D485 Neoplasm of uncertain behavior of skin: Secondary | ICD-10-CM | POA: Diagnosis not present

## 2022-03-26 DIAGNOSIS — D2261 Melanocytic nevi of right upper limb, including shoulder: Secondary | ICD-10-CM | POA: Diagnosis not present

## 2022-03-26 DIAGNOSIS — D2272 Melanocytic nevi of left lower limb, including hip: Secondary | ICD-10-CM | POA: Diagnosis not present

## 2022-03-26 DIAGNOSIS — D2271 Melanocytic nevi of right lower limb, including hip: Secondary | ICD-10-CM | POA: Diagnosis not present

## 2022-03-26 DIAGNOSIS — D2262 Melanocytic nevi of left upper limb, including shoulder: Secondary | ICD-10-CM | POA: Diagnosis not present

## 2022-04-11 DIAGNOSIS — D044 Carcinoma in situ of skin of scalp and neck: Secondary | ICD-10-CM | POA: Diagnosis not present

## 2022-05-24 ENCOUNTER — Other Ambulatory Visit: Payer: Self-pay

## 2022-05-24 MED ORDER — SCOPOLAMINE 1 MG/3DAYS TD PT72
MEDICATED_PATCH | TRANSDERMAL | 0 refills | Status: AC
  Filled 2022-05-24: qty 4, 12d supply, fill #0

## 2022-05-31 ENCOUNTER — Other Ambulatory Visit (HOSPITAL_COMMUNITY): Payer: Self-pay

## 2022-05-31 MED ORDER — FENOFIBRATE 160 MG PO TABS
160.0000 mg | ORAL_TABLET | Freq: Every day | ORAL | 3 refills | Status: DC
Start: 2022-05-31 — End: 2023-06-02
  Filled 2022-05-31: qty 90, 90d supply, fill #0
  Filled 2022-09-11: qty 90, 90d supply, fill #1
  Filled 2022-12-05: qty 90, 90d supply, fill #2
  Filled 2023-03-05: qty 90, 90d supply, fill #3

## 2022-05-31 MED ORDER — ROSUVASTATIN CALCIUM 40 MG PO TABS
40.0000 mg | ORAL_TABLET | Freq: Every day | ORAL | 3 refills | Status: DC
  Filled 2022-05-31: qty 90, 90d supply, fill #0
  Filled 2022-09-11: qty 90, 90d supply, fill #1
  Filled 2022-12-05: qty 90, 90d supply, fill #2
  Filled 2023-03-05: qty 90, 90d supply, fill #3

## 2022-05-31 MED ORDER — ATENOLOL 50 MG PO TABS
50.0000 mg | ORAL_TABLET | Freq: Every day | ORAL | 3 refills | Status: AC
Start: 2022-05-31 — End: ?
  Filled 2022-05-31: qty 90, 90d supply, fill #0
  Filled 2022-09-11: qty 90, 90d supply, fill #1
  Filled 2022-12-05: qty 90, 90d supply, fill #2
  Filled 2023-03-05: qty 90, 90d supply, fill #3

## 2022-06-09 ENCOUNTER — Other Ambulatory Visit (HOSPITAL_COMMUNITY): Payer: Self-pay

## 2022-06-11 ENCOUNTER — Other Ambulatory Visit (HOSPITAL_COMMUNITY): Payer: Self-pay

## 2022-06-11 MED ORDER — BUTALBITAL-APAP-CAFFEINE 50-325-40 MG PO TABS
1.0000 | ORAL_TABLET | Freq: Every day | ORAL | 3 refills | Status: DC | PRN
  Filled 2022-06-11: qty 4, 4d supply, fill #0
  Filled 2022-06-11: qty 41, 41d supply, fill #0
  Filled 2022-08-28: qty 45, 45d supply, fill #1
  Filled 2022-11-24: qty 45, 45d supply, fill #2

## 2022-07-09 ENCOUNTER — Other Ambulatory Visit (HOSPITAL_COMMUNITY): Payer: Self-pay

## 2022-08-28 ENCOUNTER — Other Ambulatory Visit (HOSPITAL_COMMUNITY): Payer: Self-pay

## 2022-09-05 ENCOUNTER — Other Ambulatory Visit: Payer: 59

## 2022-09-06 ENCOUNTER — Other Ambulatory Visit: Payer: 59

## 2022-09-11 ENCOUNTER — Other Ambulatory Visit (HOSPITAL_COMMUNITY): Payer: Self-pay

## 2022-10-02 ENCOUNTER — Other Ambulatory Visit: Payer: Commercial Managed Care - PPO

## 2022-10-02 DIAGNOSIS — E291 Testicular hypofunction: Secondary | ICD-10-CM

## 2022-10-03 ENCOUNTER — Telehealth: Payer: Self-pay | Admitting: *Deleted

## 2022-10-03 LAB — TESTOSTERONE: Testosterone: 126 ng/dL — ABNORMAL LOW (ref 264–916)

## 2022-10-03 LAB — HEMATOCRIT: Hematocrit: 43.2 % (ref 37.5–51.0)

## 2022-10-03 NOTE — Telephone Encounter (Signed)
-----  Message from [name], MD sent at 10/03/2022 12:17 PM EST ----- Testosterone level low at 126.  Has he missed any recent doses?

## 2022-10-07 ENCOUNTER — Encounter: Payer: Self-pay | Admitting: *Deleted

## 2022-10-07 NOTE — Telephone Encounter (Signed)
Sent mychart message

## 2022-10-21 DIAGNOSIS — E785 Hyperlipidemia, unspecified: Secondary | ICD-10-CM | POA: Diagnosis not present

## 2022-10-21 DIAGNOSIS — R739 Hyperglycemia, unspecified: Secondary | ICD-10-CM | POA: Diagnosis not present

## 2022-10-21 DIAGNOSIS — I10 Essential (primary) hypertension: Secondary | ICD-10-CM | POA: Diagnosis not present

## 2022-10-21 DIAGNOSIS — E291 Testicular hypofunction: Secondary | ICD-10-CM | POA: Diagnosis not present

## 2022-10-21 DIAGNOSIS — Z1212 Encounter for screening for malignant neoplasm of rectum: Secondary | ICD-10-CM | POA: Diagnosis not present

## 2022-10-21 DIAGNOSIS — R7989 Other specified abnormal findings of blood chemistry: Secondary | ICD-10-CM | POA: Diagnosis not present

## 2022-10-21 DIAGNOSIS — K219 Gastro-esophageal reflux disease without esophagitis: Secondary | ICD-10-CM | POA: Diagnosis not present

## 2022-10-28 DIAGNOSIS — I700 Atherosclerosis of aorta: Secondary | ICD-10-CM | POA: Diagnosis not present

## 2022-10-28 DIAGNOSIS — R82998 Other abnormal findings in urine: Secondary | ICD-10-CM | POA: Diagnosis not present

## 2022-10-28 DIAGNOSIS — E669 Obesity, unspecified: Secondary | ICD-10-CM | POA: Diagnosis not present

## 2022-10-28 DIAGNOSIS — E785 Hyperlipidemia, unspecified: Secondary | ICD-10-CM | POA: Diagnosis not present

## 2022-10-28 DIAGNOSIS — E291 Testicular hypofunction: Secondary | ICD-10-CM | POA: Diagnosis not present

## 2022-10-28 DIAGNOSIS — R7401 Elevation of levels of liver transaminase levels: Secondary | ICD-10-CM | POA: Diagnosis not present

## 2022-10-28 DIAGNOSIS — J069 Acute upper respiratory infection, unspecified: Secondary | ICD-10-CM | POA: Diagnosis not present

## 2022-10-28 DIAGNOSIS — R739 Hyperglycemia, unspecified: Secondary | ICD-10-CM | POA: Diagnosis not present

## 2022-10-28 DIAGNOSIS — I10 Essential (primary) hypertension: Secondary | ICD-10-CM | POA: Diagnosis not present

## 2022-10-28 DIAGNOSIS — R945 Abnormal results of liver function studies: Secondary | ICD-10-CM | POA: Diagnosis not present

## 2022-10-28 DIAGNOSIS — Z0000 Encounter for general adult medical examination without abnormal findings: Secondary | ICD-10-CM | POA: Diagnosis not present

## 2022-11-04 ENCOUNTER — Other Ambulatory Visit: Payer: Self-pay | Admitting: Urology

## 2022-11-04 ENCOUNTER — Other Ambulatory Visit (HOSPITAL_COMMUNITY): Payer: Self-pay

## 2022-11-05 ENCOUNTER — Other Ambulatory Visit (HOSPITAL_COMMUNITY): Payer: Self-pay

## 2022-11-05 MED ORDER — FLUTICASONE PROPIONATE 50 MCG/ACT NA SUSP
2.0000 | Freq: Every day | NASAL | 3 refills | Status: DC
  Filled 2022-11-05: qty 48, 90d supply, fill #0
  Filled 2023-02-11: qty 48, 90d supply, fill #1
  Filled 2023-06-05: qty 48, 90d supply, fill #2
  Filled 2023-09-07: qty 48, 90d supply, fill #3

## 2022-11-05 MED ORDER — TESTOSTERONE CYPIONATE 200 MG/ML IM SOLN
200.0000 mg | INTRAMUSCULAR | 0 refills | Status: DC
  Filled 2022-11-05: qty 6, 84d supply, fill #0

## 2022-11-06 ENCOUNTER — Other Ambulatory Visit (HOSPITAL_COMMUNITY): Payer: Self-pay

## 2022-11-25 ENCOUNTER — Other Ambulatory Visit (HOSPITAL_COMMUNITY): Payer: Self-pay

## 2022-12-05 ENCOUNTER — Other Ambulatory Visit: Payer: Self-pay

## 2022-12-05 ENCOUNTER — Other Ambulatory Visit (HOSPITAL_COMMUNITY): Payer: Self-pay

## 2023-01-29 ENCOUNTER — Other Ambulatory Visit: Payer: Self-pay

## 2023-01-29 DIAGNOSIS — M19011 Primary osteoarthritis, right shoulder: Secondary | ICD-10-CM | POA: Diagnosis not present

## 2023-01-29 DIAGNOSIS — M25811 Other specified joint disorders, right shoulder: Secondary | ICD-10-CM | POA: Diagnosis not present

## 2023-01-29 DIAGNOSIS — M7581 Other shoulder lesions, right shoulder: Secondary | ICD-10-CM | POA: Diagnosis not present

## 2023-01-29 MED ORDER — MELOXICAM 15 MG PO TABS
15.0000 mg | ORAL_TABLET | Freq: Every day | ORAL | 1 refills | Status: AC
  Filled 2023-01-29 (×2): qty 30, 30d supply, fill #0
  Filled 2023-02-23: qty 30, 30d supply, fill #1

## 2023-02-10 ENCOUNTER — Other Ambulatory Visit (HOSPITAL_BASED_OUTPATIENT_CLINIC_OR_DEPARTMENT_OTHER): Payer: Self-pay

## 2023-03-05 ENCOUNTER — Other Ambulatory Visit (HOSPITAL_COMMUNITY): Payer: Self-pay

## 2023-03-06 ENCOUNTER — Other Ambulatory Visit (HOSPITAL_COMMUNITY): Payer: Self-pay

## 2023-03-07 ENCOUNTER — Other Ambulatory Visit: Payer: Self-pay

## 2023-03-07 ENCOUNTER — Other Ambulatory Visit (HOSPITAL_COMMUNITY): Payer: Self-pay

## 2023-03-07 DIAGNOSIS — E291 Testicular hypofunction: Secondary | ICD-10-CM

## 2023-03-07 DIAGNOSIS — Z125 Encounter for screening for malignant neoplasm of prostate: Secondary | ICD-10-CM

## 2023-03-07 MED ORDER — BUTALBITAL-APAP-CAFFEINE 50-325-40 MG PO TABS
1.0000 | ORAL_TABLET | Freq: Every day | ORAL | 3 refills | Status: AC | PRN
  Filled 2023-03-07: qty 45, 45d supply, fill #0
  Filled 2023-06-01: qty 45, 45d supply, fill #1
  Filled 2023-08-10: qty 45, 45d supply, fill #2

## 2023-03-10 ENCOUNTER — Other Ambulatory Visit: Payer: Commercial Managed Care - PPO

## 2023-03-10 ENCOUNTER — Other Ambulatory Visit (HOSPITAL_COMMUNITY): Payer: Self-pay

## 2023-03-10 DIAGNOSIS — Z125 Encounter for screening for malignant neoplasm of prostate: Secondary | ICD-10-CM

## 2023-03-10 DIAGNOSIS — E291 Testicular hypofunction: Secondary | ICD-10-CM | POA: Diagnosis not present

## 2023-03-11 LAB — TESTOSTERONE: Testosterone: 103 ng/dL — ABNORMAL LOW (ref 264–916)

## 2023-03-11 LAB — HEMOGLOBIN AND HEMATOCRIT, BLOOD
Hematocrit: 46.5 % (ref 37.5–51.0)
Hemoglobin: 15.9 g/dL (ref 13.0–17.7)

## 2023-03-11 LAB — PSA: Prostate Specific Ag, Serum: 1.3 ng/mL (ref 0.0–4.0)

## 2023-03-12 DIAGNOSIS — M7581 Other shoulder lesions, right shoulder: Secondary | ICD-10-CM | POA: Diagnosis not present

## 2023-03-12 DIAGNOSIS — M19011 Primary osteoarthritis, right shoulder: Secondary | ICD-10-CM | POA: Diagnosis not present

## 2023-03-12 DIAGNOSIS — M25811 Other specified joint disorders, right shoulder: Secondary | ICD-10-CM | POA: Diagnosis not present

## 2023-03-17 ENCOUNTER — Ambulatory Visit: Payer: 59 | Admitting: Urology

## 2023-03-25 DIAGNOSIS — H5203 Hypermetropia, bilateral: Secondary | ICD-10-CM | POA: Diagnosis not present

## 2023-03-25 DIAGNOSIS — H1045 Other chronic allergic conjunctivitis: Secondary | ICD-10-CM | POA: Diagnosis not present

## 2023-03-28 DIAGNOSIS — L821 Other seborrheic keratosis: Secondary | ICD-10-CM | POA: Diagnosis not present

## 2023-03-28 DIAGNOSIS — Z08 Encounter for follow-up examination after completed treatment for malignant neoplasm: Secondary | ICD-10-CM | POA: Diagnosis not present

## 2023-03-28 DIAGNOSIS — D225 Melanocytic nevi of trunk: Secondary | ICD-10-CM | POA: Diagnosis not present

## 2023-03-28 DIAGNOSIS — Z85828 Personal history of other malignant neoplasm of skin: Secondary | ICD-10-CM | POA: Diagnosis not present

## 2023-03-31 ENCOUNTER — Encounter: Payer: Self-pay | Admitting: Urology

## 2023-03-31 ENCOUNTER — Ambulatory Visit: Payer: Commercial Managed Care - PPO | Admitting: Urology

## 2023-03-31 VITALS — BP 129/70 | HR 76 | Ht 67.0 in | Wt 210.0 lb

## 2023-03-31 DIAGNOSIS — E291 Testicular hypofunction: Secondary | ICD-10-CM | POA: Diagnosis not present

## 2023-03-31 NOTE — Progress Notes (Signed)
I, Jackson Vaughan,acting as a scribe for Jackson Altes, MD.,have documented all relevant documentation on the behalf of Jackson Altes, MD,as directed by  Jackson Altes, MD while in the presence of Jackson Altes, MD.  03/31/2023 3:44 PM   Jackson Vaughan 26-Apr-1961 [id number]  Referring provider: Creola Corn, MD [address] [place],  [place] [address]  Chief Complaint  Patient presents with   Hypogonadism   Urologic history: 1.  Hypogonadism Diagnosed 08/2017 Started TRT January 2019 Decreased libido, tiredness, fatigue   2.  Erectile dysfunction Multiple organic risk factors Flushing with sildenafil Trial tadalafil 02/2019  HPI: Jackson Vaughan is a 62 y.o. male here for annual follow-up of hypogonadism.  His wife gives his injections and this has been sporadic since his mother-in-law has been ill. It has been several weeks since his last injections Labs 03/10/23 remarkable for testosterone level of 103, PSA 1.3, and H/H 15.9-46.5.  He has noted some increased nocturia from 1 time a night to 2 occasionally 3 times per night.  Symptoms currently not bothersome enough that he desires medical management.   PSA trend   Prostate Specific Ag, Serum  Latest Ref Rng 0.0 - 4.0 ng/mL  06/29/2018 1.2   10/19/2018 1.1   02/12/2019 1.5   08/25/2019 1.3   02/23/2020 1.3   02/19/2021 0.8   03/05/2022 1.5   03/10/2023 1.3      PMH: Past Medical History:  Diagnosis Date   GERD (gastroesophageal reflux disease)    Hypertension    Migraine headache    2x/month   Seasonal allergies    Vertigo    1 episode, approx 20 yrs ago    Surgical History: Past Surgical History:  Procedure Laterality Date   COLONOSCOPY WITH PROPOFOL N/A 07/10/2020   Procedure: COLONOSCOPY WITH BIOPSY;  Surgeon: Pasty Spillers, MD;  Location: [facility];  Service: Endoscopy;  Laterality: N/A;   HERNIA REPAIR     POLYPECTOMY N/A 07/10/2020   Procedure: POLYPECTOMY;  Surgeon:  Pasty Spillers, MD;  Location: [facility];  Service: Endoscopy;  Laterality: N/A;   RHINOPLASTY      Home Medications:  Allergies as of 03/31/2023   No Known Allergies      Medication List        Accurate as of March 31, 2023  3:44 PM. If you have any questions, ask your nurse or doctor.          ascorbic acid 500 MG tablet Commonly known as: VITAMIN C Take 500 mg daily by mouth.   ATENOLOL PO Take by mouth.   atenolol 50 MG tablet Commonly known as: TENORMIN TAKE 1 TABLET BY MOUTH ONCE DAILY   atenolol 50 MG tablet Commonly known as: TENORMIN Take 1 tablet (50 mg total) by mouth daily.   butalbital-acetaminophen-caffeine 50-325-40 MG tablet Commonly known as: FIORICET   butalbital-acetaminophen-caffeine 50-325-40 MG tablet Commonly known as: FIORICET Take 1 tablet by mouth daily as needed for severe migraines.   fenofibrate 160 MG tablet Take 160 mg by mouth daily.   fenofibrate 160 MG tablet Take 1 tablet (160 mg total) by mouth daily.   FISH OIL PO Take by mouth.   fluticasone 50 MCG/ACT nasal spray Commonly known as: FLONASE Place 2 sprays into both nostrils daily as directed.   meloxicam 15 MG tablet Commonly known as: MOBIC Take 1 tablet (15 mg total) by mouth daily.   pantoprazole 40 MG tablet Commonly known  as: PROTONIX Take 40 mg by mouth daily.   pantoprazole 40 MG tablet Commonly known as: PROTONIX TAKE 1 TABLET BY MOUTH ONCE DAILY.   pantoprazole 40 MG tablet Commonly known as: PROTONIX Take 1 tablet (40 mg total) by mouth daily.   rosuvastatin 40 MG tablet Commonly known as: CRESTOR Take 1 tablet (40 mg total) by mouth daily.   scopolamine 1 MG/3DAYS Commonly known as: Transderm-Scop Apply 1 patch behind ear every 72 hours as directed (Apply 1 patch behind ear q 72 hours as directed-patient needs box of #4 Transdermal 30 days)   tadalafil 20 MG tablet Commonly known as: CIALIS 1 tablet by mouth 30-60 minutes  prior to intercourse   testosterone cypionate 200 MG/ML injection Commonly known as: DEPOTESTOSTERONE CYPIONATE Inject 1 mL (200 mg total) into the muscle every 14 (fourteen) days.   vitamin E 180 MG (400 UNITS) capsule Take 400 Units daily by mouth.        Allergies: No Known Allergies  Family History: Family History  Problem Relation Age of Onset   Kidney cancer Neg Hx    Kidney disease Neg Hx    Prostate cancer Neg Hx     Social History:  reports that he quit smoking about 19 years ago. His smoking use included cigarettes. He has never used smokeless tobacco. He reports that he does not drink alcohol and does not use drugs.   Physical Exam: BP 129/70   Pulse 76   Ht 5\' 7"  (1.702 m)   Wt 210 lb (95.3 kg)   BMI 32.89 kg/m   Constitutional:  Alert and oriented, No acute distress. HEENT: [place] AT, moist mucus membranes.  Trachea midline, no masses. Cardiovascular: No clubbing, cyanosis, or edema. Respiratory: Normal respiratory effort, no increased work of breathing. GI: Abdomen is soft, nontender, nondistended, no abdominal masses Skin: No rashes, bruises or suspicious lesions. Neurologic: Grossly intact, no focal deficits, moving all 4 extremities. Psychiatric: Normal mood and affect.   Assessment & Plan:    1. Hypogonadism Low T level due to decrease injection frequency. Could consider switching to a topical gel or testosterone pellets if desired. We'll go ahead and schedule a 6 month lab visit and annual office visit.  [facility] [address] [place], [place] [address] [phone]

## 2023-04-03 ENCOUNTER — Encounter: Payer: Self-pay | Admitting: Urology

## 2023-05-15 ENCOUNTER — Other Ambulatory Visit (HOSPITAL_COMMUNITY): Payer: Self-pay

## 2023-05-16 ENCOUNTER — Other Ambulatory Visit (HOSPITAL_COMMUNITY): Payer: Self-pay

## 2023-05-16 MED ORDER — PANTOPRAZOLE SODIUM 40 MG PO TBEC
40.0000 mg | DELAYED_RELEASE_TABLET | Freq: Every day | ORAL | 3 refills | Status: DC
  Filled 2023-05-16: qty 90, 90d supply, fill #0
  Filled 2023-08-10: qty 90, 90d supply, fill #1
  Filled 2023-11-09: qty 90, 90d supply, fill #2
  Filled 2024-02-04: qty 90, 90d supply, fill #3

## 2023-05-27 ENCOUNTER — Emergency Department (HOSPITAL_COMMUNITY): Payer: Commercial Managed Care - PPO

## 2023-05-27 ENCOUNTER — Emergency Department (HOSPITAL_COMMUNITY)
Admission: EM | Admit: 2023-05-27 | Discharge: 2023-05-27 | Disposition: A | Discharge status: Home / Self Care | Payer: PRIVATE HEALTH INSURANCE | Attending: Emergency Medicine | Admitting: Emergency Medicine

## 2023-05-27 ENCOUNTER — Other Ambulatory Visit (HOSPITAL_COMMUNITY): Payer: Self-pay

## 2023-05-27 DIAGNOSIS — I10 Essential (primary) hypertension: Secondary | ICD-10-CM | POA: Insufficient documentation

## 2023-05-27 DIAGNOSIS — Z23 Encounter for immunization: Secondary | ICD-10-CM | POA: Insufficient documentation

## 2023-05-27 DIAGNOSIS — S0101XA Laceration without foreign body of scalp, initial encounter: Secondary | ICD-10-CM | POA: Diagnosis not present

## 2023-05-27 DIAGNOSIS — Y9301 Activity, walking, marching and hiking: Secondary | ICD-10-CM | POA: Insufficient documentation

## 2023-05-27 DIAGNOSIS — Z79899 Other long term (current) drug therapy: Secondary | ICD-10-CM | POA: Insufficient documentation

## 2023-05-27 DIAGNOSIS — S0990XA Unspecified injury of head, initial encounter: Secondary | ICD-10-CM | POA: Diagnosis present

## 2023-05-27 DIAGNOSIS — W1830XA Fall on same level, unspecified, initial encounter: Secondary | ICD-10-CM | POA: Diagnosis not present

## 2023-05-27 DIAGNOSIS — Y990 Civilian activity done for income or pay: Secondary | ICD-10-CM | POA: Insufficient documentation

## 2023-05-27 LAB — CBG MONITORING, ED: Glucose-Capillary: 110 mg/dL — ABNORMAL HIGH (ref 70–99)

## 2023-05-27 MED ORDER — TETANUS-DIPHTH-ACELL PERTUSSIS 5-2.5-18.5 LF-MCG/0.5 IM SUSY
0.5000 mL | PREFILLED_SYRINGE | Freq: Once | INTRAMUSCULAR | Status: AC
  Administered 2023-05-27: 0.5 mL via INTRAMUSCULAR
  Filled 2023-05-27: qty 0.5

## 2023-05-27 MED ORDER — CEPHALEXIN 500 MG PO CAPS
1000.0000 mg | ORAL_CAPSULE | Freq: Two times a day (BID) | ORAL | 0 refills | Status: AC
  Filled 2023-05-27: qty 28, 7d supply, fill #0

## 2023-05-27 MED ORDER — ACETAMINOPHEN 500 MG PO TABS
1000.0000 mg | ORAL_TABLET | Freq: Once | ORAL | Status: AC
  Administered 2023-05-27: 1000 mg via ORAL
  Filled 2023-05-27: qty 2

## 2023-05-27 MED ORDER — IBUPROFEN 800 MG PO TABS
800.0000 mg | ORAL_TABLET | Freq: Once | ORAL | Status: AC
  Administered 2023-05-27: 800 mg via ORAL
  Filled 2023-05-27: qty 1

## 2023-05-27 MED ORDER — LIDOCAINE-EPINEPHRINE-TETRACAINE (LET) TOPICAL GEL
3.0000 mL | Freq: Once | TOPICAL | Status: AC
  Administered 2023-05-27: 3 mL via TOPICAL
  Filled 2023-05-27: qty 3

## 2023-05-27 MED ORDER — CEPHALEXIN 250 MG PO CAPS
500.0000 mg | ORAL_CAPSULE | Freq: Once | ORAL | Status: AC
  Administered 2023-05-27: 500 mg via ORAL
  Filled 2023-05-27: qty 2

## 2023-05-27 MED ORDER — LIDOCAINE HCL (PF) 1 % IJ SOLN
20.0000 mL | Freq: Once | INTRAMUSCULAR | Status: DC
  Filled 2023-05-27: qty 20

## 2023-05-27 NOTE — Discharge Instructions (Addendum)
Sutured repair Keep the laceration site dry for the next 24 hours and leave the dressing in place. After 24 hours you may remove the dressing and gently clean the laceration site with antibacterial soap and warm water. Do not scrub the area. Do not soak the area and water for long periods of time. Don't use hydrogen peroxide, iodine-based solutions, or alcohol, which can slow healing, and will probably be painful! Apply topical bacitracin 1-2 times per day for the next 3-5 days. Return to the emergency department in 5-7 days for removal of the sutures.  You should return sooner for any signs of infection which would include increased redness around the wound, increased swelling, new drainage of yellow pus.   I also sent Keflex to her pharmacy.  Please take that to prevent associated infection with the laceration.

## 2023-05-27 NOTE — ED Provider Notes (Signed)
[facility] [facility] AT [facility] Provider Note   CSN: [id number] Arrival date & time: 05/27/23  0809     History  Chief Complaint  Patient presents with   Fall   Head Injury   HPI Jackson Vaughan is a 62 y.o. male with hypertension presenting for fall and head injury.  Patient is a Consulting civil engineer here at [employer] and was walking and that he control room and when he hit his head with a large bolt.  It scraped the top of his head.  At that point he fell to the ground but did not lose consciousness. States there was a lot of blood initially but bleeding was controlled with direct pressure with gauze.  Now with what he believes is a 2-1/2 inch laceration on top of his head. Denies visual disturbance.  States he does take a aspirin every now and then for headaches but denies any other blood thinners.  States he did take his atenolol for his hypertension this morning.  States last tetanus shot was in 2016.   Fall  Head Injury      Home Medications Prior to Admission medications   Medication Sig Start Date End Date Taking? Authorizing Provider  atenolol (TENORMIN) 50 MG tablet TAKE 1 TABLET BY MOUTH ONCE DAILY 06/01/20 01/30/22  Creola Corn, MD  atenolol (TENORMIN) 50 MG tablet Take 1 tablet (50 mg total) by mouth daily. 05/31/22     ATENOLOL PO Take by mouth.    [provider]  butalbital-acetaminophen-caffeine (FIORICET) 50-325-40 MG tablet Take 1 tablet by mouth daily as needed for severe migraines. 03/07/23     butalbital-acetaminophen-caffeine (FIORICET, ESGIC) 50-325-40 MG tablet  05/13/18   [provider]  fenofibrate 160 MG tablet Take 160 mg by mouth daily. 09/29/17   [provider]  fenofibrate 160 MG tablet Take 1 tablet (160 mg total) by mouth daily. 05/31/22     fluticasone (FLONASE) 50 MCG/ACT nasal spray Place 2 sprays into both nostrils daily as directed. 11/04/22     meloxicam (MOBIC) 15 MG tablet Take 1 tablet (15 mg  total) by mouth daily. 01/29/23     Omega-3 Fatty Acids (FISH OIL PO) Take by mouth.    [provider]  pantoprazole (PROTONIX) 40 MG tablet Take 40 mg by mouth daily. 09/29/17   [provider]  pantoprazole (PROTONIX) 40 MG tablet Take 1 tablet (40 mg total) by mouth daily. 03/06/22     pantoprazole (PROTONIX) 40 MG tablet Take 1 tablet (40 mg total) by mouth daily. 05/16/23     rosuvastatin (CRESTOR) 40 MG tablet Take 1 tablet (40 mg total) by mouth daily. 05/31/22     scopolamine (TRANSDERM-SCOP) 1 MG/3DAYS Apply 1 patch behind ear q 72 hours as directed-patient needs box of #4 Transdermal 30 days 05/24/22     tadalafil (CIALIS) 20 MG tablet 1 tablet by mouth 30-60 minutes prior to intercourse 02/18/19   Stoioff, Verna Czech, MD  testosterone cypionate (DEPOTESTOSTERONE CYPIONATE) 200 MG/ML injection Inject 1 mL (200 mg total) into the muscle every 14 (fourteen) days. 11/05/22   Stoioff, Verna Czech, MD  vitamin C (ASCORBIC ACID) 500 MG tablet Take 500 mg daily by mouth.    [provider]  vitamin E 400 UNIT capsule Take 400 Units daily by mouth.    [provider]      Allergies    Patient has no known allergies.    Review of Systems   See  HPI for pertinent positives  Physical Exam   Vitals:   05/27/23 1236 05/27/23 1405  BP:  (!) 191/90  Pulse:  68  Resp:  13  Temp: 98 F (36.7 C) 98 F (36.7 C)  SpO2:  100%    CONSTITUTIONAL:  well-appearing, NAD NEURO:  GCS 15. Speech is goal oriented. No deficits appreciated to CN III-XII; symmetric eyebrow raise, no facial drooping, tongue midline. Patient has equal grip strength bilaterally with 5/5 strength against resistance in all major muscle groups bilaterally. Sensation to light touch intact. Patient moves extremities without ataxia. Normal finger-nose-finger. Patient ambulatory with steady gait. Head: Approximate 5 cm laceration to the superior scalp.  Not actively bleeding but surrounding dried blood noted.  No  Battle sign, raccoon eyes, or rhinorrhea. EYES:  eyes equal and reactive ENT/NECK:  Supple, no stridor CARDIO: appears well-perfused PULM:  No respiratory distress GI/GU:  non-distended MSK/SPINE:  No gross deformities, no edema, moves all extremities  SKIN:  no rash, atraumatic   *Additional and/or pertinent findings included in MDM below   ED Results / Procedures / Treatments   Labs (all labs ordered are listed, but only abnormal results are displayed) Labs Reviewed  CBG MONITORING, ED - Abnormal; Notable for the following components:      Result Value   Glucose-Capillary 110 (*)    All other components within normal limits    EKG None  Radiology CT Head Wo Contrast  Result Date: 05/27/2023 CLINICAL DATA:  Polytrauma, blunt.  Head strike and fall. EXAM: CT HEAD WITHOUT CONTRAST TECHNIQUE: Contiguous axial images were obtained from the base of the skull through the vertex without intravenous contrast. RADIATION DOSE REDUCTION: This exam was performed according to the departmental dose-optimization program which includes automated exposure control, adjustment of the mA and/or kV according to patient size and/or use of iterative reconstruction technique. COMPARISON:  None Available. FINDINGS: Brain: No acute intracranial hemorrhage. Gray-white differentiation is preserved. No hydrocephalus or extra-axial collection. No mass effect or midline shift. Vascular: No hyperdense vessel or unexpected calcification. Skull: No calvarial fracture or suspicious bone lesion. Skull base is unremarkable. Sinuses/Orbits: No acute finding. Other: None. IMPRESSION: No evidence of acute intracranial injury. Electronically Signed   By: Orvan Falconer M.D.   On: 05/27/2023 12:17    Procedures .Marland KitchenLaceration Repair  Date/Time: 05/27/2023 2:07 PM  Performed by: Gareth Eagle, PA-C Authorized by: Gareth Eagle, PA-C   Consent:    Consent obtained:  Verbal   Consent given by:  Patient   Risks  discussed:  Infection and retained foreign body   Alternatives discussed:  No treatment Universal protocol:    Procedure explained and questions answered to patient or proxy's satisfaction: yes     Relevant documents present and verified: yes     Patient identity confirmed:  Verbally with patient and arm band Anesthesia:    Anesthesia method:  Topical application   Topical anesthetic:  LET Laceration details:    Location:  Scalp   Scalp location:  Crown   Length (cm):  5   Depth (mm):  4 Pre-procedure details:    Preparation:  Patient was prepped and draped in usual sterile fashion Exploration:    Hemostasis achieved with:  Direct pressure   Imaging obtained comment:  CT head   Wound exploration: wound explored through full range of motion     Wound extent: areolar tissue violated   Treatment:    Area cleansed with:  Saline   Amount of cleaning:  Extensive   Irrigation solution:  Sterile saline   Irrigation method:  Pressure wash   Visualized foreign bodies/material removed: no   Skin repair:    Repair method:  Staples   Number of staples:  6 Approximation:    Approximation:  Close Repair type:    Repair type:  Simple Post-procedure details:    Dressing:  Non-adherent dressing     Medications Ordered in ED Medications  cephALEXin (KEFLEX) capsule 500 mg (has no administration in time range)  ibuprofen (ADVIL) tablet 800 mg (has no administration in time range)  Tdap (BOOSTRIX) injection 0.5 mL (has no administration in time range)  acetaminophen (TYLENOL) tablet 1,000 mg (1,000 mg Oral Given 05/27/23 0959)  lidocaine-EPINEPHrine-tetracaine (LET) topical gel (3 mLs Topical Given 05/27/23 1256)  lidocaine (PF) (XYLOCAINE) 1 % injection 20 mL (20 mLs Infiltration Given by Other 05/27/23 1403)    ED Course/ Medical Decision Making/ A&P Clinical Course as of 05/27/23 1407  Tue May 27, 2023  1338 CT Head Wo Contrast [JR]    Clinical Course User Index [JR] Gareth Eagle, PA-C                                 Medical Decision Making Amount and/or Complexity of Data Reviewed Radiology: ordered. Decision-making details documented in ED Course.  Risk OTC drugs. Prescription drug management.   62 year old well-appearing male presenting for fall and head injury.  Exam notable for scalp laceration but otherwise reassuring.  DDx includes skull fracture, ICH, cervical spine injury.  Have low suspicion for skull fracture and ICH or other stroke given reassuring exam without focal neurodeficits.  CT scan of the head was also unremarkable.  Laceration repair was well-tolerated.  Started on Keflex to treat empirically for associated infection given what is likely a dirty environment where the incident occurred.  Gave tetanus shot.  Discussed wound care at home.  Advised to follow-up PCP.  Discussed return precautions.  Vital stable.  Discharged home in good condition.        Final Clinical Impression(s) / ED Diagnoses Final diagnoses:  None    Rx / DC Orders ED Discharge Orders     None         Gareth Eagle, PA-C 05/27/23 1434    Lonell Grandchild, MD 05/28/23 [phone]

## 2023-05-27 NOTE — ED Notes (Signed)
Patient transported to CT 

## 2023-05-27 NOTE — ED Triage Notes (Signed)
Pt. Stated, I was working on the roof and did not bend down far enough and a nail went into the top of my head, left, top area. No LOC. Pt was a little light headed when moved and feel light headed now.

## 2023-06-01 ENCOUNTER — Other Ambulatory Visit (HOSPITAL_COMMUNITY): Payer: Self-pay

## 2023-06-02 ENCOUNTER — Other Ambulatory Visit (HOSPITAL_COMMUNITY): Payer: Self-pay

## 2023-06-02 MED ORDER — FENOFIBRATE 160 MG PO TABS
160.0000 mg | ORAL_TABLET | Freq: Every day | ORAL | 3 refills | Status: DC
  Filled 2023-06-02: qty 90, 90d supply, fill #0
  Filled 2023-09-07: qty 90, 90d supply, fill #1
  Filled 2023-12-02: qty 90, 90d supply, fill #2
  Filled 2024-03-07: qty 90, 90d supply, fill #3

## 2023-06-05 ENCOUNTER — Other Ambulatory Visit (HOSPITAL_COMMUNITY): Payer: Self-pay

## 2023-06-06 ENCOUNTER — Other Ambulatory Visit (HOSPITAL_COMMUNITY): Payer: Self-pay

## 2023-06-06 MED ORDER — ROSUVASTATIN CALCIUM 40 MG PO TABS
40.0000 mg | ORAL_TABLET | Freq: Every day | ORAL | 3 refills | Status: DC
  Filled 2023-06-06: qty 90, 90d supply, fill #0
  Filled 2023-09-07: qty 90, 90d supply, fill #1
  Filled 2023-12-02: qty 90, 90d supply, fill #2
  Filled 2024-03-07: qty 90, 90d supply, fill #3

## 2023-06-06 MED ORDER — ATENOLOL 50 MG PO TABS
50.0000 mg | ORAL_TABLET | Freq: Every day | ORAL | 3 refills | Status: DC
  Filled 2023-06-06: qty 90, 90d supply, fill #0
  Filled 2023-09-07: qty 90, 90d supply, fill #1
  Filled 2023-12-02: qty 90, 90d supply, fill #2
  Filled 2024-03-07: qty 90, 90d supply, fill #3

## 2023-06-23 ENCOUNTER — Telehealth: Payer: Self-pay | Admitting: Gastroenterology

## 2023-06-23 ENCOUNTER — Other Ambulatory Visit: Payer: Self-pay

## 2023-06-23 ENCOUNTER — Other Ambulatory Visit (HOSPITAL_COMMUNITY): Payer: Self-pay

## 2023-06-23 ENCOUNTER — Telehealth: Payer: Self-pay

## 2023-06-23 DIAGNOSIS — Z860100 Personal history of colon polyps, unspecified: Secondary | ICD-10-CM

## 2023-06-23 MED ORDER — NA SULFATE-K SULFATE-MG SULF 17.5-3.13-1.6 GM/177ML PO SOLN
1.0000 | Freq: Once | ORAL | 0 refills | Status: AC
Start: 2023-06-23 — End: 2023-07-01
  Filled 2023-06-23: qty 354, 2d supply, fill #0

## 2023-06-23 NOTE — Telephone Encounter (Signed)
The patient and his wife Olegario Messier) called in to schedule his colonoscopy with Dr. Servando Snare. Per Pasty Spillers, MD  T I recommend you have a repeat colonoscopy in 3 years to determine if you have developed any new polyps and to screen for colorectal cancer.

## 2023-06-23 NOTE — Telephone Encounter (Signed)
Gastroenterology Pre-Procedure Review  Request Date: 07/23/23 Requesting Physician: Dr. Servando Snare  PATIENT REVIEW QUESTIONS: The patient responded to the following health history questions as indicated:    1. Are you having any GI issues? no 2. Do you have a personal history of Polyps? yes (yes last colonoscopy performed by Dr. Maximino Greenland 07/10/2020) 3. Do you have a family history of Colon Cancer or Polyps? no 4. Diabetes Mellitus? no 5. Joint replacements in the past 12 months?no 6. Major health problems in the past 3 months?no 7. Any artificial heart valves, MVP, or defibrillator?no    MEDICATIONS & ALLERGIES:    Patient reports the following regarding taking any anticoagulation/antiplatelet therapy:   Plavix, Coumadin, Eliquis, Xarelto, Lovenox, Pradaxa, Brilinta, or Effient? no Aspirin? no  Patient confirms/reports the following medications:  Current Outpatient Medications  Medication Sig Dispense Refill   atenolol (TENORMIN) 50 MG tablet TAKE 1 TABLET BY MOUTH ONCE DAILY 90 tablet 3   atenolol (TENORMIN) 50 MG tablet Take 1 tablet (50 mg total) by mouth daily. 90 tablet 3   ATENOLOL PO Take by mouth.     butalbital-acetaminophen-caffeine (FIORICET) 50-325-40 MG tablet Take 1 tablet by mouth daily as needed for severe migraines. 45 tablet 3   butalbital-acetaminophen-caffeine (FIORICET, ESGIC) 50-325-40 MG tablet      fenofibrate 160 MG tablet Take 160 mg by mouth daily.  3   fenofibrate 160 MG tablet Take 1 tablet (160 mg total) by mouth daily. 90 tablet 3   fluticasone (FLONASE) 50 MCG/ACT nasal spray Place 2 sprays into both nostrils daily as directed. 48 g 3   meloxicam (MOBIC) 15 MG tablet Take 1 tablet (15 mg total) by mouth daily. 30 tablet 1   Omega-3 Fatty Acids (FISH OIL PO) Take by mouth.     pantoprazole (PROTONIX) 40 MG tablet Take 40 mg by mouth daily.  2   pantoprazole (PROTONIX) 40 MG tablet Take 1 tablet (40 mg total) by mouth daily. 90 tablet 3   pantoprazole  (PROTONIX) 40 MG tablet Take 1 tablet (40 mg total) by mouth daily. 90 tablet 3   rosuvastatin (CRESTOR) 40 MG tablet Take 1 tablet (40 mg total) by mouth daily. 90 tablet 3   scopolamine (TRANSDERM-SCOP) 1 MG/3DAYS Apply 1 patch behind ear q 72 hours as directed-patient needs box of #4 Transdermal 30 days 4 patch 0   tadalafil (CIALIS) 20 MG tablet 1 tablet by mouth 30-60 minutes prior to intercourse 30 tablet 1   testosterone cypionate (DEPOTESTOSTERONE CYPIONATE) 200 MG/ML injection Inject 1 mL (200 mg total) into the muscle every 14 (fourteen) days. 6 mL 0   vitamin C (ASCORBIC ACID) 500 MG tablet Take 500 mg daily by mouth.     vitamin E 400 UNIT capsule Take 400 Units daily by mouth.     No current facility-administered medications for this visit.    Patient confirms/reports the following allergies:  No Known Allergies  No orders of the defined types were placed in this encounter.   AUTHORIZATION INFORMATION Primary Insurance: 1D#: Group #:  Secondary Insurance: 1D#: Group #:  SCHEDULE INFORMATION: Date: 07/23/23 Time: Location: [facility]

## 2023-06-24 ENCOUNTER — Other Ambulatory Visit (HOSPITAL_COMMUNITY): Payer: Self-pay

## 2023-07-16 ENCOUNTER — Encounter: Payer: Self-pay | Admitting: Gastroenterology

## 2023-07-22 ENCOUNTER — Encounter: Payer: Self-pay | Admitting: Gastroenterology

## 2023-07-23 ENCOUNTER — Encounter
Admission: RE | Disposition: A | Discharge status: Home / Self Care | Payer: Self-pay | Source: Home / Self Care | Attending: Gastroenterology

## 2023-07-23 ENCOUNTER — Ambulatory Visit: Payer: Commercial Managed Care - PPO

## 2023-07-23 ENCOUNTER — Ambulatory Visit
Admission: RE | Admit: 2023-07-23 | Discharge: 2023-07-23 | Disposition: A | Discharge status: Home / Self Care | Payer: Commercial Managed Care - PPO | Attending: Gastroenterology | Admitting: Gastroenterology

## 2023-07-23 DIAGNOSIS — Z87891 Personal history of nicotine dependence: Secondary | ICD-10-CM | POA: Insufficient documentation

## 2023-07-23 DIAGNOSIS — K219 Gastro-esophageal reflux disease without esophagitis: Secondary | ICD-10-CM | POA: Diagnosis not present

## 2023-07-23 DIAGNOSIS — I10 Essential (primary) hypertension: Secondary | ICD-10-CM | POA: Diagnosis not present

## 2023-07-23 DIAGNOSIS — D123 Benign neoplasm of transverse colon: Secondary | ICD-10-CM | POA: Diagnosis not present

## 2023-07-23 DIAGNOSIS — K635 Polyp of colon: Secondary | ICD-10-CM | POA: Diagnosis not present

## 2023-07-23 DIAGNOSIS — Z860101 Personal history of adenomatous and serrated colon polyps: Secondary | ICD-10-CM | POA: Diagnosis not present

## 2023-07-23 DIAGNOSIS — D124 Benign neoplasm of descending colon: Secondary | ICD-10-CM | POA: Insufficient documentation

## 2023-07-23 DIAGNOSIS — K5730 Diverticulosis of large intestine without perforation or abscess without bleeding: Secondary | ICD-10-CM | POA: Insufficient documentation

## 2023-07-23 DIAGNOSIS — Z860100 Personal history of colon polyps, unspecified: Secondary | ICD-10-CM | POA: Diagnosis not present

## 2023-07-23 DIAGNOSIS — Z09 Encounter for follow-up examination after completed treatment for conditions other than malignant neoplasm: Secondary | ICD-10-CM | POA: Insufficient documentation

## 2023-07-23 DIAGNOSIS — K640 First degree hemorrhoids: Secondary | ICD-10-CM | POA: Diagnosis not present

## 2023-07-23 HISTORY — PX: POLYPECTOMY: SHX5525

## 2023-07-23 HISTORY — PX: COLONOSCOPY WITH PROPOFOL: SHX5780

## 2023-07-23 SURGERY — COLONOSCOPY WITH PROPOFOL
Anesthesia: General

## 2023-07-23 MED ORDER — EPHEDRINE SULFATE-NACL 50-0.9 MG/10ML-% IV SOSY
PREFILLED_SYRINGE | INTRAVENOUS | Status: DC | PRN
  Administered 2023-07-23: 5 mg via INTRAVENOUS

## 2023-07-23 MED ORDER — LIDOCAINE HCL (CARDIAC) PF 100 MG/5ML IV SOSY
PREFILLED_SYRINGE | INTRAVENOUS | Status: DC | PRN
  Administered 2023-07-23: 50 mg via INTRAVENOUS

## 2023-07-23 MED ORDER — SODIUM CHLORIDE 0.9 % IV SOLN
INTRAVENOUS | Status: DC
  Administered 2023-07-23: 20 mL/h via INTRAVENOUS

## 2023-07-23 MED ORDER — LIDOCAINE HCL (CARDIAC) PF 100 MG/5ML IV SOSY: PREFILLED_SYRINGE | INTRAVENOUS | Status: DC | PRN

## 2023-07-23 MED ORDER — PROPOFOL 10 MG/ML IV BOLUS
INTRAVENOUS | Status: DC | PRN
  Administered 2023-07-23: 100 mg via INTRAVENOUS

## 2023-07-23 MED ORDER — EPHEDRINE 5 MG/ML INJ
INTRAVENOUS | Status: AC
  Filled 2023-07-23: qty 5

## 2023-07-23 MED ORDER — PROPOFOL 10 MG/ML IV BOLUS
INTRAVENOUS | Status: AC
  Filled 2023-07-23: qty 40

## 2023-07-23 MED ORDER — PROPOFOL 500 MG/50ML IV EMUL
INTRAVENOUS | Status: DC | PRN
  Administered 2023-07-23: 100 ug/kg/min via INTRAVENOUS

## 2023-07-23 NOTE — Anesthesia Postprocedure Evaluation (Signed)
Anesthesia Post Note  Patient: Jackson Vaughan  Procedure(s) Performed: COLONOSCOPY WITH PROPOFOL POLYPECTOMY  Patient location during evaluation: Endoscopy Anesthesia Type: General Level of consciousness: awake and alert Pain management: pain level controlled Vital Signs Assessment: post-procedure vital signs reviewed and stable Respiratory status: spontaneous breathing, nonlabored ventilation, respiratory function stable and patient connected to nasal cannula oxygen Cardiovascular status: blood pressure returned to baseline and stable Postop Assessment: no apparent nausea or vomiting Anesthetic complications: no   No notable events documented.   Last Vitals:  Vitals:   07/23/23 1055 07/23/23 1105  BP:    Pulse: 60 (!) 56  Resp:    Temp:    SpO2: 100% 100%    Last Pain:  Vitals:   07/23/23 1105  TempSrc:   PainSc: 0-No pain                 Cleda Mccreedy Lynetta Tomczak

## 2023-07-23 NOTE — Transfer of Care (Signed)
Immediate Anesthesia Transfer of Care Note  Patient: Jackson Vaughan  Procedure(s) Performed: COLONOSCOPY WITH PROPOFOL POLYPECTOMY  Patient Location: PACU  Anesthesia Type:MAC  Level of Consciousness: awake  Airway & Oxygen Therapy: Patient Spontanous Breathing  Post-op Assessment: Report given to RN and Post -op Vital signs reviewed and stable  Post vital signs: Reviewed and stable  Last Vitals:  Vitals Value Taken Time  BP 100/77 07/23/23 1046  Temp 36.1 C 07/23/23 1045  Pulse 64 07/23/23 1047  Resp 14 07/23/23 1047  SpO2 98 % 07/23/23 1047  Vitals shown include unfiled device data.  Last Pain:  Vitals:   07/23/23 1045  TempSrc: Temporal  PainSc: 0-No pain         Complications: No notable events documented.

## 2023-07-23 NOTE — Anesthesia Preprocedure Evaluation (Signed)
Anesthesia Evaluation  Patient identified by MRN, date of birth, ID band Patient awake    Reviewed: Allergy & Precautions, NPO status , Patient's Chart, lab work & pertinent test results  History of Anesthesia Complications Negative for: history of anesthetic complications  Airway Mallampati: III  TM Distance: <3 FB Neck ROM: full    Dental  (+) Chipped   Pulmonary neg shortness of breath, former smoker   Pulmonary exam normal        Cardiovascular Exercise Tolerance: Good hypertension, (-) angina Normal cardiovascular exam     Neuro/Psych  Headaches  negative psych ROS   GI/Hepatic Neg liver ROS,GERD  Controlled,,  Endo/Other  negative endocrine ROS    Renal/GU negative Renal ROS  negative genitourinary   Musculoskeletal   Abdominal   Peds  Hematology negative hematology ROS (+)   Anesthesia Other Findings Past Medical History: No date: GERD (gastroesophageal reflux disease) No date: Hypertension No date: Migraine headache     Comment:  2x/month No date: Seasonal allergies No date: Vertigo     Comment:  1 episode, approx 20 yrs ago  Past Surgical History: 07/10/2020: COLONOSCOPY WITH PROPOFOL; N/A     Comment:  Procedure: COLONOSCOPY WITH BIOPSY;  Surgeon: Pasty Spillers, MD;  Location: [facility];  Service:               Endoscopy;  Laterality: N/A; No date: HERNIA REPAIR 07/10/2020: POLYPECTOMY; N/A     Comment:  Procedure: POLYPECTOMY;  Surgeon: Pasty Spillers,               MD;  Location: [facility];  Service: Endoscopy;               Laterality: N/A; No date: RHINOPLASTY  BMI    Body Mass Index: 32.11 kg/m      Reproductive/Obstetrics negative OB ROS                             Anesthesia Physical Anesthesia Plan  ASA: 2  Anesthesia Plan: General   Post-op Pain Management:    Induction: Intravenous  PONV Risk Score  and Plan: Propofol infusion and TIVA  Airway Management Planned: Natural Airway and Nasal Cannula  Additional Equipment:   Intra-op Plan:   Post-operative Plan:   Informed Consent: I have reviewed the patients History and Physical, chart, labs and discussed the procedure including the risks, benefits and alternatives for the proposed anesthesia with the patient or authorized representative who has indicated his/her understanding and acceptance.     Dental Advisory Given  Plan Discussed with: Anesthesiologist, CRNA and Surgeon  Anesthesia Plan Comments: (Patient consented for risks of anesthesia including but not limited to:  - adverse reactions to medications - risk of airway placement if required - damage to eyes, teeth, lips or other oral mucosa - nerve damage due to positioning  - sore throat or hoarseness - Damage to heart, brain, nerves, lungs, other parts of body or loss of life  Patient voiced understanding and assent.)       Anesthesia Quick Evaluation

## 2023-07-23 NOTE — Op Note (Signed)
[facility] Gastroenterology Patient Name: Jackson Vaughan Procedure Date: 07/23/2023 10:06 AM MRN: [id number] Account #: [id number] Date of Birth: February 20, 1961 Admit Type: Outpatient Age: 62 Room: [facility] ENDO ROOM 1 Gender: Male Note Status: Finalized Instrument Name: Prentice Docker [id number] Procedure:             Colonoscopy Indications:           High risk colon cancer surveillance: Personal history                         of colonic polyps Providers:             Midge Minium MD, MD Referring MD:          Creola Corn MD, MD (Referring MD) Complications:         No immediate complications. Procedure:             Pre-Anesthesia Assessment:                        - Prior to the procedure, a History and Physical was                         performed, and patient medications and allergies were                         reviewed. The patient's tolerance of previous                         anesthesia was also reviewed. The risks and benefits                         of the procedure and the sedation options and risks                         were discussed with the patient. All questions were                         answered, and informed consent was obtained. Prior                         Anticoagulants: The patient has taken no anticoagulant                         or antiplatelet agents. ASA Grade Assessment: II - A                         patient with mild systemic disease. After reviewing                         the risks and benefits, the patient was deemed in                         satisfactory condition to undergo the procedure.                        After obtaining informed consent, the colonoscope was                         passed under direct vision.  Throughout the procedure,                         the patient's blood pressure, pulse, and oxygen                         saturations were monitored continuously. The                         Colonoscope was introduced  through the anus and                         advanced to the the cecum, identified by appendiceal                         orifice and ileocecal valve. The colonoscopy was                         performed without difficulty. The patient tolerated                         the procedure well. The quality of the bowel                         preparation was excellent. Findings:      The perianal and digital rectal examinations were normal.      A few small-mouthed diverticula were found in the sigmoid colon.      Three sessile polyps were found in the transverse colon. The polyps were       2 to 3 mm in size. These polyps were removed with a cold snare.       Resection and retrieval were complete.      Three sessile polyps were found in the descending colon. The polyps were       2 to 3 mm in size. These polyps were removed with a cold snare.       Resection and retrieval were complete.      Non-bleeding internal hemorrhoids were found during retroflexion. The       hemorrhoids were Grade I (internal hemorrhoids that do not prolapse). Impression:            - Diverticulosis in the sigmoid colon.                        - Three 2 to 3 mm polyps in the transverse colon,                         removed with a cold snare. Resected and retrieved.                        - Three 2 to 3 mm polyps in the descending colon,                         removed with a cold snare. Resected and retrieved.                        - Non-bleeding internal hemorrhoids. Recommendation:        - Discharge patient to home.                        -  Resume previous diet.                        - Continue present medications.                        - Await pathology results.                        - Repeat colonoscopy in 3 years for surveillance. Procedure Code(s):     --- Professional ---                        [id number], Colonoscopy, flexible; with removal of                         tumor(s), polyp(s), or other lesion(s)  by snare                         technique Diagnosis Code(s):     --- Professional ---                        Z86.010, Personal history of colonic polyps                        D12.4, Benign neoplasm of descending colon CPT copyright 2022 [employer]. All rights reserved. The codes documented in this report are preliminary and upon coder review may  be revised to meet current compliance requirements. Midge Minium MD, MD 07/23/2023 10:40:47 AM This report has been signed electronically. Number of Addenda: 0 Note Initiated On: 07/23/2023 10:06 AM Scope Withdrawal Time: 0 hours 6 minutes 6 seconds  Total Procedure Duration: 0 hours 9 minutes 4 seconds  Estimated Blood Loss:  Estimated blood loss: none.      [facility]

## 2023-07-23 NOTE — H&P (Signed)
Midge Minium, MD [facility] [address] [place], [place] [address] [phone] Fax : [phone]  Primary Care Physician:  Creola Corn, MD Primary Gastroenterologist:  Dr. Servando Snare  Pre-Procedure History & Physical: HPI:  Jackson Vaughan is a 62 y.o. male is here for an colonoscopy.   Past Medical History:  Diagnosis Date   GERD (gastroesophageal reflux disease)    Hypertension    Migraine headache    2x/month   Seasonal allergies    Vertigo    1 episode, approx 20 yrs ago    Past Surgical History:  Procedure Laterality Date   COLONOSCOPY WITH PROPOFOL N/A 07/10/2020   Procedure: COLONOSCOPY WITH BIOPSY;  Surgeon: Pasty Spillers, MD;  Location: [facility];  Service: Endoscopy;  Laterality: N/A;   HERNIA REPAIR     POLYPECTOMY N/A 07/10/2020   Procedure: POLYPECTOMY;  Surgeon: Pasty Spillers, MD;  Location: [facility];  Service: Endoscopy;  Laterality: N/A;   RHINOPLASTY      Prior to Admission medications   Medication Sig Start Date End Date Taking? Authorizing Provider  atenolol (TENORMIN) 50 MG tablet Take 1 tablet (50 mg total) by mouth daily. 06/06/23  Yes   ATENOLOL PO Take by mouth.   Yes [provider]  butalbital-acetaminophen-caffeine (FIORICET) 50-325-40 MG tablet Take 1 tablet by mouth daily as needed for severe migraines. 03/07/23  Yes   butalbital-acetaminophen-caffeine (FIORICET, ESGIC) 50-325-40 MG tablet  05/13/18  Yes [provider]  fenofibrate 160 MG tablet Take 160 mg by mouth daily. 09/29/17  Yes [provider]  fenofibrate 160 MG tablet Take 1 tablet (160 mg total) by mouth daily. 06/02/23  Yes   fluticasone (FLONASE) 50 MCG/ACT nasal spray Place 2 sprays into both nostrils daily as directed. 11/04/22  Yes   pantoprazole (PROTONIX) 40 MG tablet Take 40 mg by mouth daily. 09/29/17  Yes [provider]  pantoprazole (PROTONIX) 40 MG tablet Take 1 tablet (40 mg total) by mouth daily.  03/06/22  Yes   pantoprazole (PROTONIX) 40 MG tablet Take 1 tablet (40 mg total) by mouth daily. 05/16/23  Yes   rosuvastatin (CRESTOR) 40 MG tablet Take 1 tablet (40 mg total) by mouth daily. 06/06/23  Yes   scopolamine (TRANSDERM-SCOP) 1 MG/3DAYS Apply 1 patch behind ear q 72 hours as directed-patient needs box of #4 Transdermal 30 days 05/24/22  Yes   tadalafil (CIALIS) 20 MG tablet 1 tablet by mouth 30-60 minutes prior to intercourse 02/18/19  Yes Stoioff, Verna Czech, MD  testosterone cypionate (DEPOTESTOSTERONE CYPIONATE) 200 MG/ML injection Inject 1 mL (200 mg total) into the muscle every 14 (fourteen) days. 11/05/22  Yes Stoioff, Verna Czech, MD  vitamin C (ASCORBIC ACID) 500 MG tablet Take 500 mg daily by mouth.   Yes [provider]  vitamin E 400 UNIT capsule Take 400 Units daily by mouth.   Yes [provider]  atenolol (TENORMIN) 50 MG tablet TAKE 1 TABLET BY MOUTH ONCE DAILY 06/01/20 01/30/22  Creola Corn, MD  meloxicam (MOBIC) 15 MG tablet Take 1 tablet (15 mg total) by mouth daily. Patient not taking: Reported on 07/16/2023 01/29/23     Omega-3 Fatty Acids (FISH OIL PO) Take by mouth.    [provider]    Allergies as of 06/23/2023   (No Known Allergies)    Family History  Problem Relation Age of Onset   Kidney cancer Neg Hx    Kidney disease Neg Hx    Prostate  cancer Neg Hx     Social History   Socioeconomic History   Marital status: Married    Spouse name: Not on file   Number of children: Not on file   Years of education: Not on file   Highest education level: Not on file  Occupational History   Not on file  Tobacco Use   Smoking status: Former    Current packs/day: 0.00    Types: Cigarettes    Quit date: 2005    Years since quitting: 19.8   Smokeless tobacco: Never   Tobacco comments:    off and on for 15 yrs  Vaping Use   Vaping status: Never Used  Substance and Sexual Activity   Alcohol use: No    Comment: rare   Drug use: Never    Sexual activity: Yes    Birth control/protection: None  Other Topics Concern   Not on file  Social History Narrative   Not on file   Social Determinants of Health   Financial Resource Strain: Not on file  Food Insecurity: Not on file  Transportation Needs: Not on file  Physical Activity: Not on file  Stress: Not on file  Social Connections: Not on file  Intimate Partner Violence: Not on file    Review of Systems: See HPI, otherwise negative ROS  Physical Exam: BP (!) 161/82   Pulse (!) 54   Temp (!) 96.9 F (36.1 C) (Temporal)   Resp 20   Ht 5\' 7"  (1.702 m)   Wt 93 kg   SpO2 100%   BMI 32.11 kg/m  General:   Alert,  pleasant and cooperative in NAD Head:  Normocephalic and atraumatic. Neck:  Supple; no masses or thyromegaly. Lungs:  Clear throughout to auscultation.    Heart:  Regular rate and rhythm. Abdomen:  Soft, nontender and nondistended. Normal bowel sounds, without guarding, and without rebound.   Neurologic:  Alert and  oriented x4;  grossly normal neurologically.  Impression/Plan: Jackson Vaughan is here for an colonoscopy to be performed for a history of adenomatous polyps on 2021   Risks, benefits, limitations, and alternatives regarding  colonoscopy have been reviewed with the patient.  Questions have been answered.  All parties agreeable.   Midge Minium, MD  07/23/2023, 10:08 AM

## 2023-07-24 ENCOUNTER — Encounter: Payer: Self-pay | Admitting: Gastroenterology

## 2023-07-24 LAB — SURGICAL PATHOLOGY

## 2023-07-26 ENCOUNTER — Encounter: Payer: Self-pay | Admitting: Gastroenterology

## 2023-09-02 ENCOUNTER — Other Ambulatory Visit: Payer: Self-pay | Admitting: Urology

## 2023-09-04 MED ORDER — TESTOSTERONE CYPIONATE 200 MG/ML IM SOLN
200.0000 mg | INTRAMUSCULAR | 0 refills | Status: DC
  Filled 2023-09-04: qty 6, 84d supply, fill #0

## 2023-09-05 ENCOUNTER — Other Ambulatory Visit (HOSPITAL_COMMUNITY): Payer: Self-pay

## 2023-09-23 ENCOUNTER — Other Ambulatory Visit: Payer: Self-pay | Admitting: *Deleted

## 2023-09-23 DIAGNOSIS — E291 Testicular hypofunction: Secondary | ICD-10-CM

## 2023-09-26 ENCOUNTER — Other Ambulatory Visit (HOSPITAL_COMMUNITY): Payer: Self-pay

## 2023-09-26 MED ORDER — ZOSTER VAC RECOMB ADJUVANTED 50 MCG/0.5ML IM SUSR
0.5000 mL | Freq: Once | INTRAMUSCULAR | 0 refills | Status: AC
  Filled 2023-09-26: qty 0.5, 1d supply, fill #0

## 2023-10-01 ENCOUNTER — Other Ambulatory Visit: Payer: Commercial Managed Care - PPO

## 2023-10-01 DIAGNOSIS — E291 Testicular hypofunction: Secondary | ICD-10-CM | POA: Diagnosis not present

## 2023-10-02 ENCOUNTER — Encounter: Payer: Self-pay | Admitting: Urology

## 2023-10-02 LAB — TESTOSTERONE: Testosterone: 964 ng/dL — ABNORMAL HIGH (ref 264–916)

## 2023-10-02 LAB — HEMOGLOBIN AND HEMATOCRIT, BLOOD
Hematocrit: 46.5 % (ref 37.5–51.0)
Hemoglobin: 15.3 g/dL (ref 13.0–17.7)

## 2023-10-28 DIAGNOSIS — I10 Essential (primary) hypertension: Secondary | ICD-10-CM | POA: Diagnosis not present

## 2023-10-28 DIAGNOSIS — Z1212 Encounter for screening for malignant neoplasm of rectum: Secondary | ICD-10-CM | POA: Diagnosis not present

## 2023-10-28 DIAGNOSIS — E785 Hyperlipidemia, unspecified: Secondary | ICD-10-CM | POA: Diagnosis not present

## 2023-10-28 DIAGNOSIS — E291 Testicular hypofunction: Secondary | ICD-10-CM | POA: Diagnosis not present

## 2023-10-28 DIAGNOSIS — R739 Hyperglycemia, unspecified: Secondary | ICD-10-CM | POA: Diagnosis not present

## 2023-11-04 DIAGNOSIS — R7401 Elevation of levels of liver transaminase levels: Secondary | ICD-10-CM | POA: Diagnosis not present

## 2023-11-04 DIAGNOSIS — F5221 Male erectile disorder: Secondary | ICD-10-CM | POA: Diagnosis not present

## 2023-11-04 DIAGNOSIS — E785 Hyperlipidemia, unspecified: Secondary | ICD-10-CM | POA: Diagnosis not present

## 2023-11-04 DIAGNOSIS — I10 Essential (primary) hypertension: Secondary | ICD-10-CM | POA: Diagnosis not present

## 2023-11-04 DIAGNOSIS — E669 Obesity, unspecified: Secondary | ICD-10-CM | POA: Diagnosis not present

## 2023-11-04 DIAGNOSIS — E291 Testicular hypofunction: Secondary | ICD-10-CM | POA: Diagnosis not present

## 2023-11-04 DIAGNOSIS — I700 Atherosclerosis of aorta: Secondary | ICD-10-CM | POA: Diagnosis not present

## 2023-11-04 DIAGNOSIS — Z0000 Encounter for general adult medical examination without abnormal findings: Secondary | ICD-10-CM | POA: Diagnosis not present

## 2023-11-04 DIAGNOSIS — H6121 Impacted cerumen, right ear: Secondary | ICD-10-CM | POA: Diagnosis not present

## 2023-11-04 DIAGNOSIS — R945 Abnormal results of liver function studies: Secondary | ICD-10-CM | POA: Diagnosis not present

## 2023-11-05 DIAGNOSIS — H6123 Impacted cerumen, bilateral: Secondary | ICD-10-CM | POA: Diagnosis not present

## 2023-11-05 DIAGNOSIS — H9313 Tinnitus, bilateral: Secondary | ICD-10-CM | POA: Diagnosis not present

## 2023-11-06 ENCOUNTER — Other Ambulatory Visit (HOSPITAL_COMMUNITY): Payer: Self-pay

## 2023-11-07 ENCOUNTER — Other Ambulatory Visit (HOSPITAL_COMMUNITY): Payer: Self-pay

## 2023-11-10 ENCOUNTER — Other Ambulatory Visit: Payer: Self-pay

## 2023-11-10 ENCOUNTER — Other Ambulatory Visit (HOSPITAL_COMMUNITY): Payer: Self-pay

## 2023-11-10 MED ORDER — BUTALBITAL-APAP-CAFFEINE 50-325-40 MG PO TABS
1.0000 | ORAL_TABLET | Freq: Every day | ORAL | 3 refills | Status: DC | PRN
  Filled 2023-11-10: qty 45, 45d supply, fill #0
  Filled 2024-02-04: qty 45, 45d supply, fill #1
  Filled 2024-03-28: qty 45, 45d supply, fill #2

## 2023-12-19 ENCOUNTER — Other Ambulatory Visit (HOSPITAL_COMMUNITY): Payer: Self-pay

## 2023-12-19 MED ORDER — ZOSTER VAC RECOMB ADJUVANTED 50 MCG/0.5ML IM SUSR
0.5000 mL | Freq: Once | INTRAMUSCULAR | 0 refills | Status: AC
  Filled 2023-12-19: qty 0.5, 1d supply, fill #0

## 2024-02-17 IMAGING — CT CT ABD-PELV W/ CM
2 of 5 series · 15 of 46 positions shown, 17 images · IV contrast (agent unspecified)
Comparison: None Available.

CLINICAL DATA: LLQ abdominal pain

EXAM:
CT ABDOMEN AND PELVIS WITH CONTRAST
TECHNIQUE: Multidetector CT imaging of the abdomen and pelvis was performed
using the standard protocol following bolus administration of
intravenous contrast.

[Series 2: abdomen 5.0 · axial · 0.90mm/px · z∈[+880,+1295]mm · 12 of 99 slices shown, 14 images]
[im 8/99  soft-tissue]
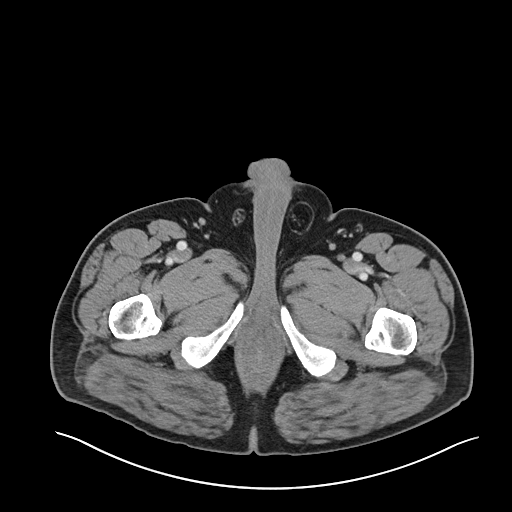
[im 8/99  bone]
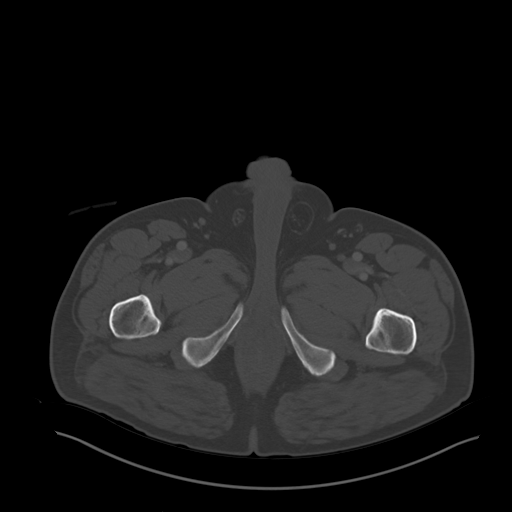
[im 16/99  soft-tissue]
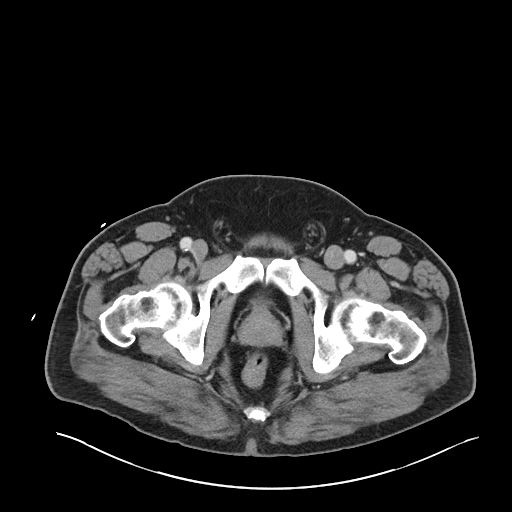
[im 23/99  soft-tissue]
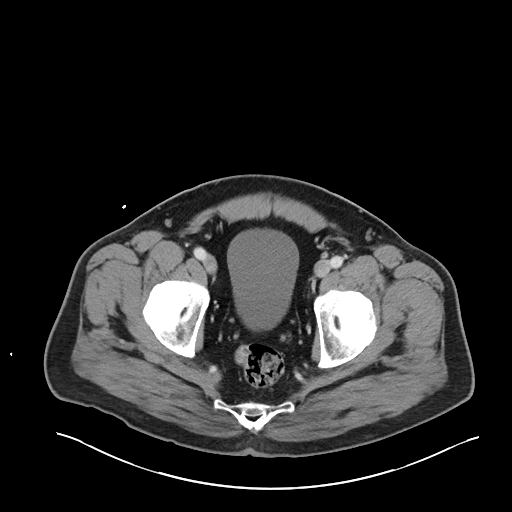
[im 31/99  soft-tissue]
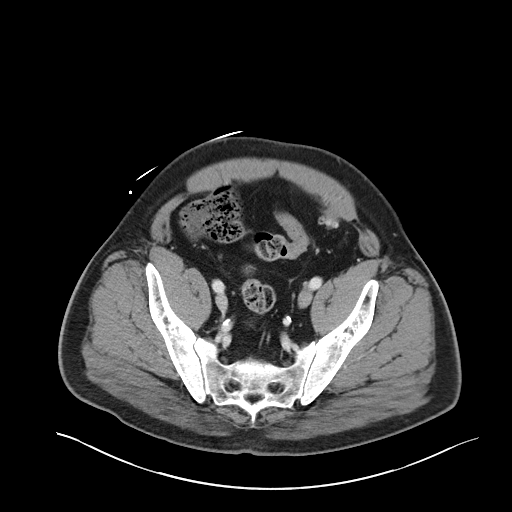
[im 38/99  soft-tissue]
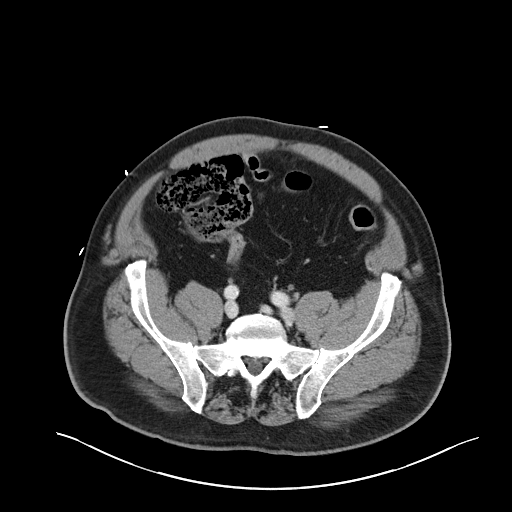
[im 46/99  soft-tissue]
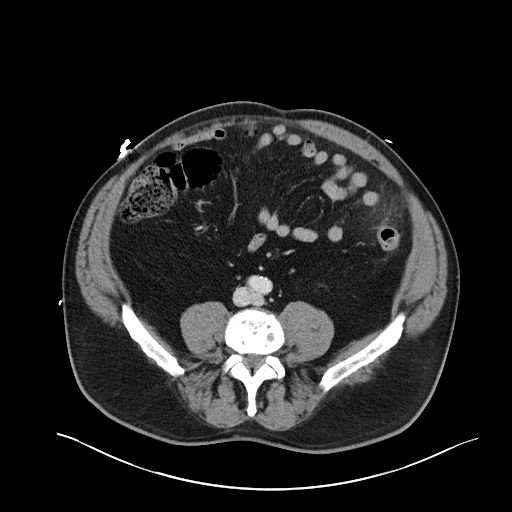
[im 53/99  soft-tissue]
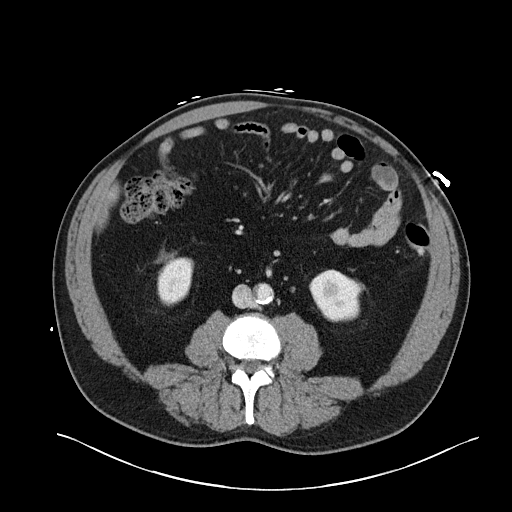
[im 61/99  soft-tissue]
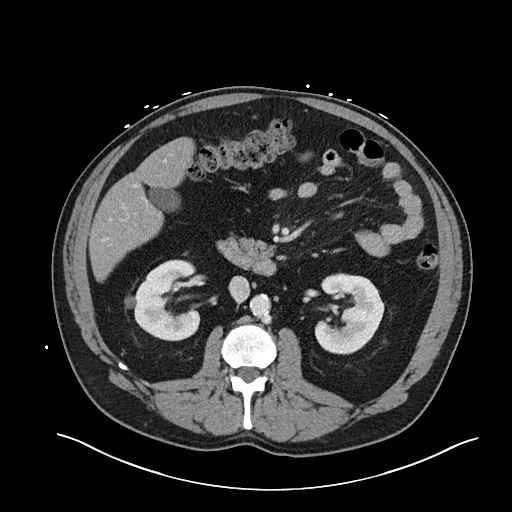
[im 68/99  soft-tissue]
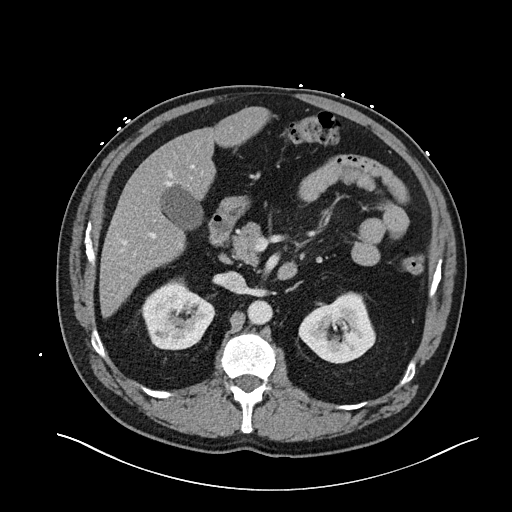
[im 68/99  bone]
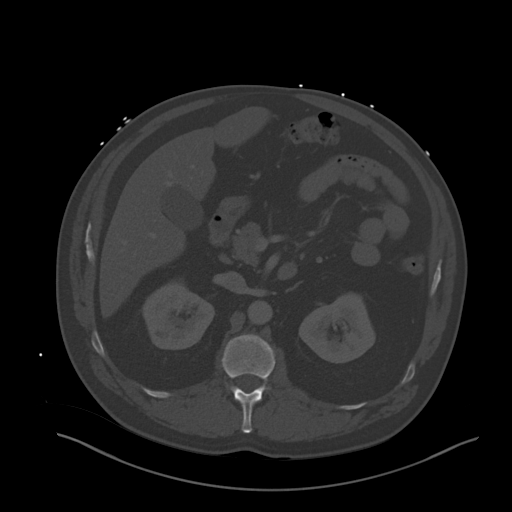
[im 76/99  soft-tissue]
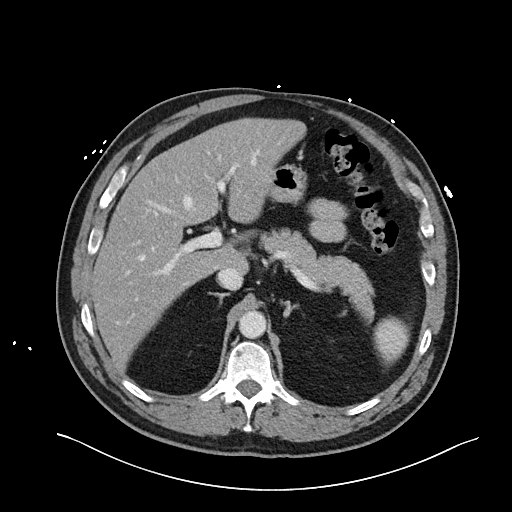
[im 83/99  soft-tissue]
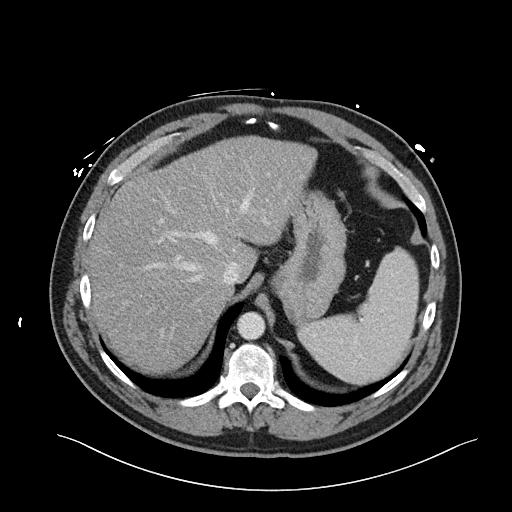
[im 91/99  soft-tissue]
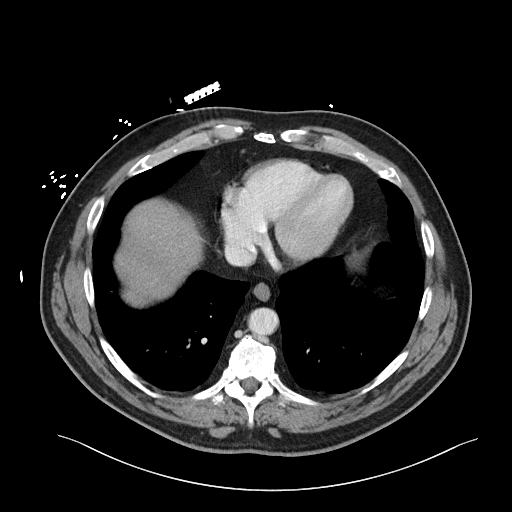

[Series 5: abdomen 3.0 mpr cor · coronal · 0.81mm/px · 3 of 113 slices shown]
[im 38/113  soft-tissue]
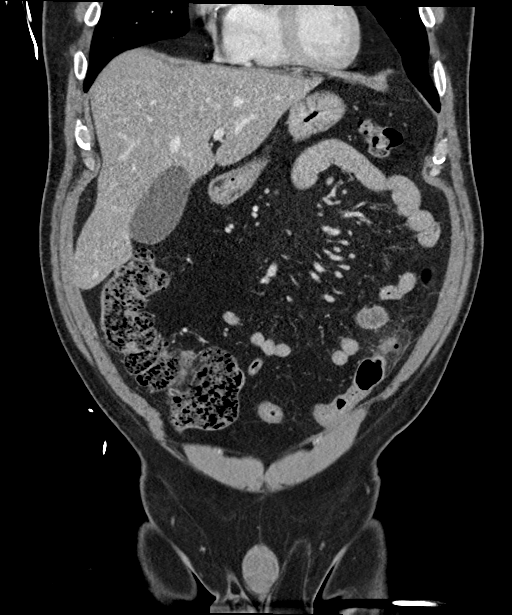
[im 50/113  soft-tissue]
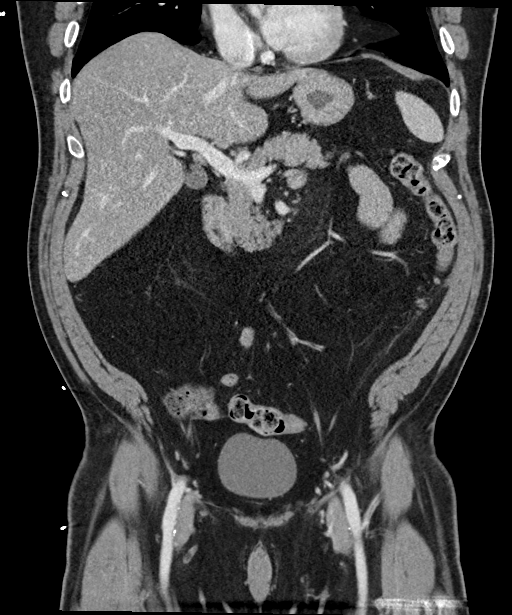
[im 63/113  soft-tissue]
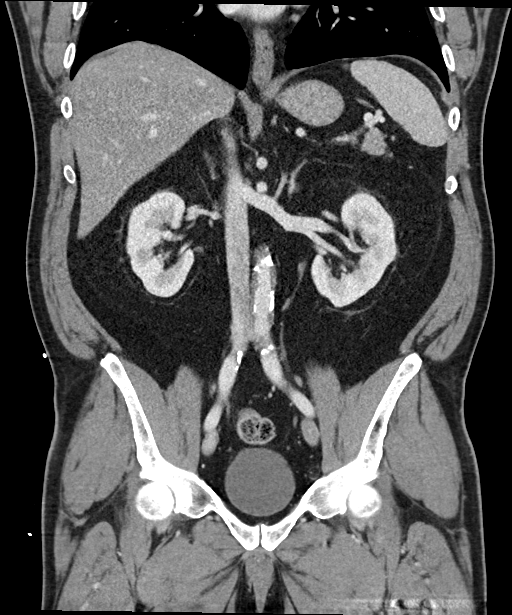

[15 of 46 positions shown; findings below may reference images not displayed]

RADIATION DOSE REDUCTION: This exam was performed according to the
departmental dose-optimization program which includes automated
exposure control, adjustment of the mA and/or kV according to
patient size and/or use of iterative reconstruction technique.

CONTRAST:  100mL OMNIPAQUE IOHEXOL 300 MG/ML  SOLN
FINDINGS: Lower chest: No pleural or pericardial effusion.

Hepatobiliary: No focal liver abnormality is seen. No gallstones,
gallbladder wall thickening, or biliary dilatation.

Pancreas: Unremarkable. No pancreatic ductal dilatation or
surrounding inflammatory changes.

Spleen: Normal in size without focal abnormality.

Adrenals/Urinary Tract: Adrenal glands are unremarkable. Kidneys are
normal, without renal calculi, significant lesion, or
hydronephrosis. Bladder is unremarkable.

Stomach/Bowel: Stomach nondistended, unremarkable. Small bowel
decompressed. No CT evidence of appendicitis. The colon is
nondilated. There are all scattered descending and sigmoid
diverticula. Inflammatory/edematous changes adjacent to the distal
descending colon. No abscess.

Vascular/Lymphatic: Moderate aortoiliac calcified atheromatous
plaque without aneurysm or evident stenosis. No abdominal or pelvic
adenopathy. Portal vein patent.

Reproductive: Prostate is unremarkable.

Other: No ascites.  No free air.

Musculoskeletal: Degenerative disc disease L4-S1.
IMPRESSION: 1. Distal descending colon diverticulitis without abscess. Consider
follow-up elective outpatient GI evaluation to exclude mucosal
lesion.

## 2024-02-17 IMAGING — DX DG CHEST 1V PORT
1 series · 1 of 1 positions shown · non-contrast
Comparison: No priors.

CLINICAL DATA: 60-year-old male with history of abdominal pain and
chills.

EXAM:
PORTABLE CHEST 1 VIEW

[chest ap]
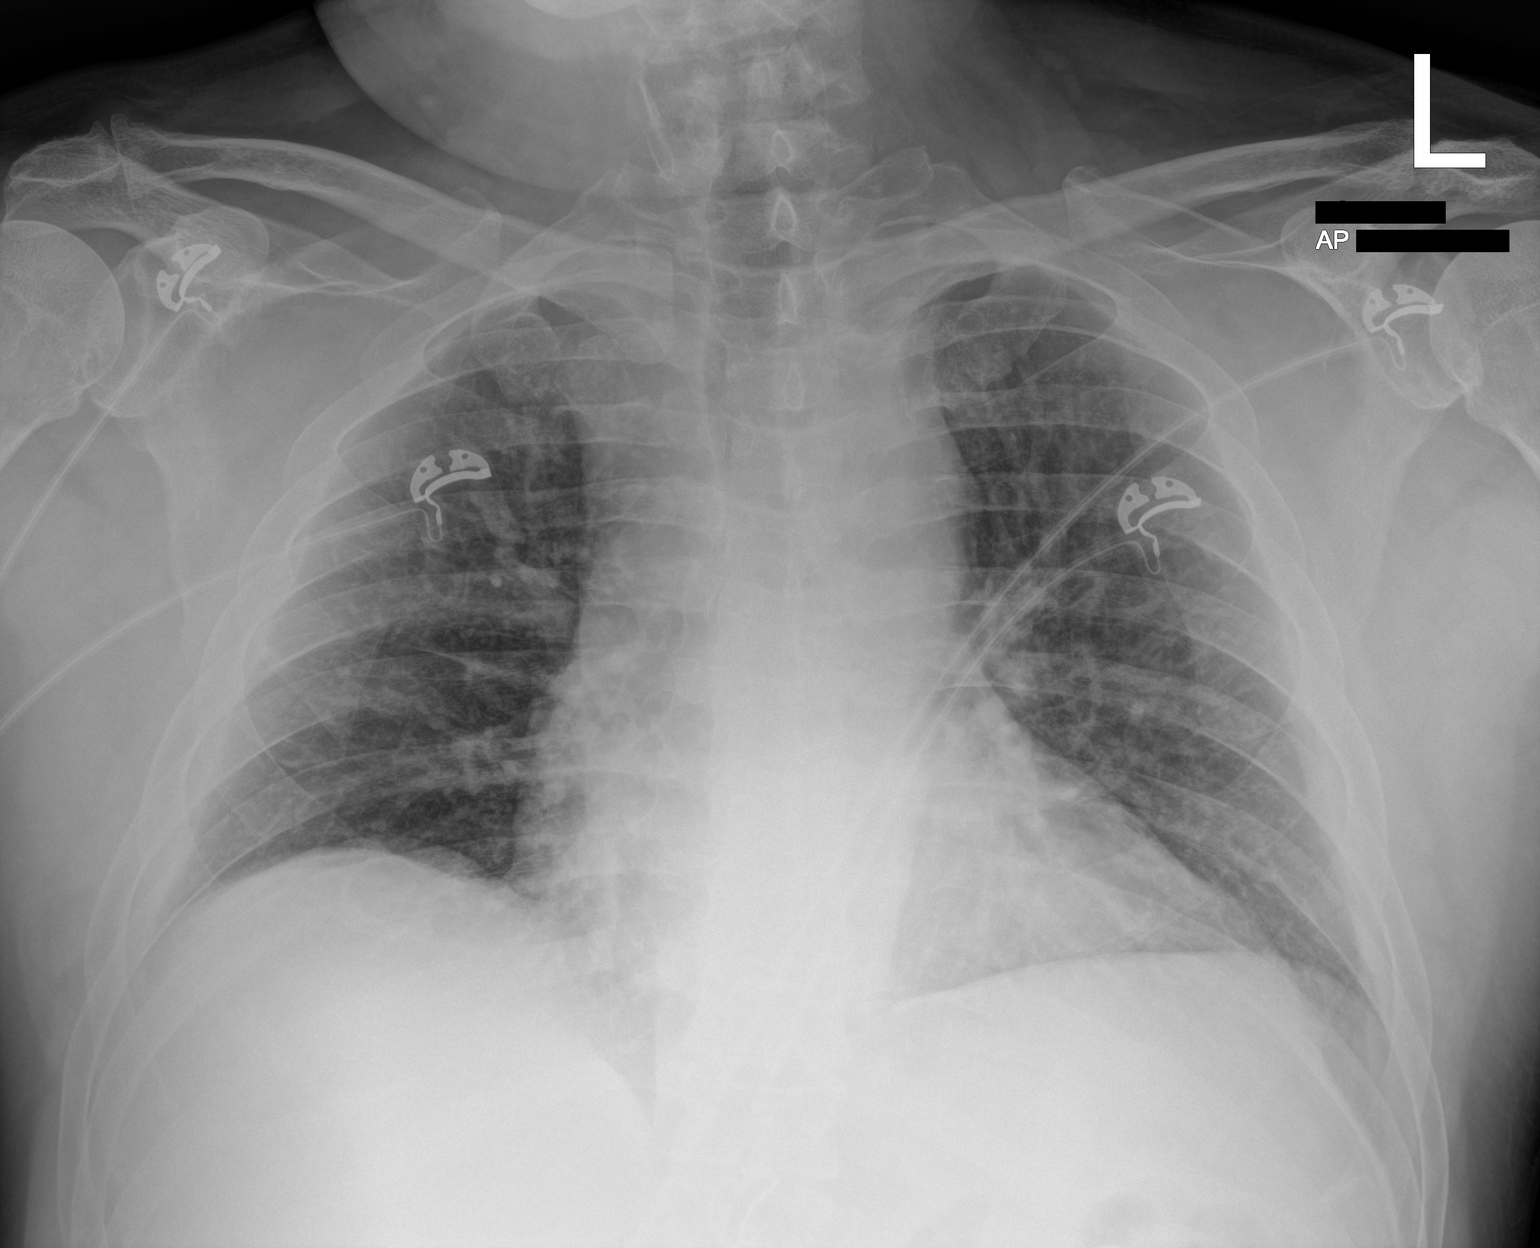

[1 of 1 positions shown; findings below may reference images not displayed]

FINDINGS: Lung volumes are low. No consolidative airspace disease. No pleural
effusions. No pneumothorax. No pulmonary nodule or mass noted.
Pulmonary vasculature and the cardiomediastinal silhouette are
within normal limits.
IMPRESSION: 1. Low lung volumes without radiographic evidence of acute
cardiopulmonary disease.

## 2024-03-07 ENCOUNTER — Other Ambulatory Visit (HOSPITAL_COMMUNITY): Payer: Self-pay

## 2024-03-08 ENCOUNTER — Other Ambulatory Visit (HOSPITAL_COMMUNITY): Payer: Self-pay

## 2024-03-08 MED ORDER — FLUTICASONE PROPIONATE 50 MCG/ACT NA SUSP
2.0000 | Freq: Every day | NASAL | 3 refills | Status: AC
  Filled 2024-03-08: qty 48, 90d supply, fill #0
  Filled 2024-06-09: qty 48, 90d supply, fill #1
  Filled 2024-09-12: qty 48, 90d supply, fill #2

## 2024-03-22 ENCOUNTER — Other Ambulatory Visit: Payer: Self-pay

## 2024-03-22 DIAGNOSIS — E291 Testicular hypofunction: Secondary | ICD-10-CM

## 2024-03-26 ENCOUNTER — Other Ambulatory Visit: Payer: Self-pay

## 2024-03-26 DIAGNOSIS — E291 Testicular hypofunction: Secondary | ICD-10-CM | POA: Diagnosis not present

## 2024-03-27 LAB — HEMOGLOBIN AND HEMATOCRIT, BLOOD

## 2024-03-29 ENCOUNTER — Other Ambulatory Visit: Payer: Self-pay

## 2024-03-30 LAB — HEMOGLOBIN AND HEMATOCRIT, BLOOD
Hematocrit: 48.6 (ref 37.5–51.0)
Hemoglobin: 15.6 g/dL (ref 13.0–17.7)

## 2024-03-30 LAB — PSA: Prostate Specific Ag, Serum: 1.3 ng/mL (ref 0.0–4.0)

## 2024-03-30 LAB — TESTOSTERONE: Testosterone: 623 ng/dL (ref 264–916)

## 2024-03-31 ENCOUNTER — Ambulatory Visit: Payer: Self-pay | Admitting: Urology

## 2024-03-31 VITALS — BP 156/87 | HR 76 | Ht 67.0 in | Wt 205.0 lb

## 2024-03-31 DIAGNOSIS — Z85828 Personal history of other malignant neoplasm of skin: Secondary | ICD-10-CM | POA: Diagnosis not present

## 2024-03-31 DIAGNOSIS — D485 Neoplasm of uncertain behavior of skin: Secondary | ICD-10-CM | POA: Diagnosis not present

## 2024-03-31 DIAGNOSIS — D0439 Carcinoma in situ of skin of other parts of face: Secondary | ICD-10-CM | POA: Diagnosis not present

## 2024-03-31 DIAGNOSIS — D225 Melanocytic nevi of trunk: Secondary | ICD-10-CM | POA: Diagnosis not present

## 2024-03-31 DIAGNOSIS — Z125 Encounter for screening for malignant neoplasm of prostate: Secondary | ICD-10-CM | POA: Diagnosis not present

## 2024-03-31 DIAGNOSIS — D2261 Melanocytic nevi of right upper limb, including shoulder: Secondary | ICD-10-CM | POA: Diagnosis not present

## 2024-03-31 DIAGNOSIS — E291 Testicular hypofunction: Secondary | ICD-10-CM | POA: Diagnosis not present

## 2024-03-31 DIAGNOSIS — D2272 Melanocytic nevi of left lower limb, including hip: Secondary | ICD-10-CM | POA: Diagnosis not present

## 2024-03-31 DIAGNOSIS — D2262 Melanocytic nevi of left upper limb, including shoulder: Secondary | ICD-10-CM | POA: Diagnosis not present

## 2024-03-31 NOTE — Progress Notes (Signed)
 03/31/2024 4:25 PM   [name] [birth year] [id number]  Referring provider: Onita Rush, MD [address] [place],  [place] [address]  Chief Complaint  Patient presents with   Hypogonadism   Urologic history: 1.  Hypogonadism Diagnosed 08/2017 Started TRT January 2019 Decreased libido, tiredness, fatigue   2.  Erectile dysfunction Multiple organic risk factors Flushing with sildenafil  Trial tadalafil  02/2019  HPI: Jackson Vaughan is a 63 y.o. male here for annual follow-up of hypogonadism.  No complaints since last visit Labs labs drawn midcycle 03/26/2024: testosterone  level of 623 ng/dL, PSA 1.3, and H/H 84.3/51.3 Mild LUTS which are stable Symptoms currently not bothersome enough that he desires medical management.   PSA trend   Prostate Specific Ag, Serum  Latest Ref Rng 0.0 - 4.0 ng/mL  06/29/2018 1.2   10/19/2018 1.1   02/12/2019 1.5   08/25/2019 1.3   02/23/2020 1.3   02/19/2021 0.8   03/05/2022 1.5   03/10/2023 1.3   03/26/2024 1.3     PMH: Past Medical History:  Diagnosis Date   GERD (gastroesophageal reflux disease)    Hypertension    Migraine headache    2x/month   Seasonal allergies    Vertigo    1 episode, approx 20 yrs ago    Surgical History: Past Surgical History:  Procedure Laterality Date   COLONOSCOPY WITH PROPOFOL  N/A 07/10/2020   Procedure: COLONOSCOPY WITH BIOPSY;  Surgeon: Janalyn Keene KATHEE, MD;  Location: [facility];  Service: Endoscopy;  Laterality: N/A;   COLONOSCOPY WITH PROPOFOL  N/A 07/23/2023   Procedure: COLONOSCOPY WITH PROPOFOL ;  Surgeon: Jinny Carmine, MD;  Location: [facility];  Service: Endoscopy;  Laterality: N/A;   HERNIA REPAIR     POLYPECTOMY N/A 07/10/2020   Procedure: POLYPECTOMY;  Surgeon: Janalyn Keene KATHEE, MD;  Location: [facility];  Service: Endoscopy;  Laterality: N/A;   POLYPECTOMY  07/23/2023   Procedure: POLYPECTOMY;  Surgeon: Jinny Carmine, MD;  Location: [facility];   Service: Endoscopy;;   RHINOPLASTY      Home Medications:  Allergies as of 03/31/2024   No Known Allergies      Medication List        Accurate as of March 31, 2024  4:25 PM. If you have any questions, ask your nurse or doctor.          ascorbic acid 500 MG tablet Commonly known as: VITAMIN C Take 500 mg daily by mouth.   ATENOLOL  PO Take by mouth.   atenolol  50 MG tablet Commonly known as: TENORMIN  TAKE 1 TABLET BY MOUTH ONCE DAILY   atenolol  50 MG tablet Commonly known as: TENORMIN  Take 1 tablet (50 mg total) by mouth daily.   butalbital -acetaminophen -caffeine  50-325-40 MG tablet Commonly known as: FIORICET    butalbital -acetaminophen -caffeine  50-325-40 MG tablet Commonly known as: FIORICET  Take 1 tablet by mouth daily as needed for severe migraines.   butalbital -acetaminophen -caffeine  50-325-40 MG tablet Commonly known as: FIORICET  Take 1 tablet by mouth daily as needed for severe migraines.   fenofibrate  160 MG tablet Take 160 mg by mouth daily.   fenofibrate  160 MG tablet Take 1 tablet (160 mg total) by mouth daily.   FISH OIL PO Take by mouth.   fluticasone  50 MCG/ACT nasal spray Commonly known as: FLONASE  Place 2 sprays into both nostrils daily.   meloxicam  15 MG tablet Commonly known as: MOBIC  Take 1 tablet (15 mg total) by mouth daily.   pantoprazole  40 MG tablet Commonly known as: PROTONIX  Take  40 mg by mouth daily.   pantoprazole  40 MG tablet Commonly known as: PROTONIX  Take 1 tablet (40 mg total) by mouth daily.   pantoprazole  40 MG tablet Commonly known as: PROTONIX  Take 1 tablet (40 mg total) by mouth daily.   rosuvastatin  40 MG tablet Commonly known as: CRESTOR  Take 1 tablet (40 mg total) by mouth daily.   scopolamine  1 MG/3DAYS Commonly known as: Transderm-Scop Apply 1 patch behind ear every 72 hours as directed (Apply 1 patch behind ear q 72 hours as directed-patient needs box of #4 Transdermal 30 days)   tadalafil  20  MG tablet Commonly known as: CIALIS  1 tablet by mouth 30-60 minutes prior to intercourse   testosterone  cypionate 200 MG/ML injection Commonly known as: DEPOTESTOSTERONE CYPIONATE Inject 1 mL (200 mg total) into the muscle every 14 (fourteen) days.   vitamin E 180 MG (400 UNITS) capsule Take 400 Units daily by mouth.        Allergies: No Known Allergies  Family History: Family History  Problem Relation Age of Onset   Kidney cancer Neg Hx    Kidney disease Neg Hx    Prostate cancer Neg Hx     Social History:  reports that he quit smoking about 20 years ago. His smoking use included cigarettes. He has never used smokeless tobacco. He reports that he does not drink alcohol  and does not use drugs.   Physical Exam: BP (!) 156/87   Pulse 76   Ht 5' 7 (1.702 m)   Wt 205 lb (93 kg)   BMI 32.11 kg/m   Constitutional:  Alert and oriented, No acute distress. HEENT: [place] AT Respiratory: Normal respiratory effort, no increased work of breathing. Psychiatric: Normal mood and affect.   Assessment & Plan:    1. Hypogonadism Stable No significant lab abnormalities Lab visit 6 months w/ testosterone , H/H Office visit 1 year with testosterone , H/H, PSA   Glendia JAYSON Barba, MD  [facility] [address], [address] [place], [place] [address] [phone]

## 2024-04-05 ENCOUNTER — Encounter: Payer: Self-pay | Admitting: Urology

## 2024-05-03 ENCOUNTER — Other Ambulatory Visit (HOSPITAL_COMMUNITY): Payer: Self-pay

## 2024-05-04 ENCOUNTER — Other Ambulatory Visit (HOSPITAL_COMMUNITY): Payer: Self-pay

## 2024-05-04 MED ORDER — PANTOPRAZOLE SODIUM 40 MG PO TBEC
40.0000 mg | DELAYED_RELEASE_TABLET | Freq: Every day | ORAL | 3 refills | Status: AC
  Filled 2024-05-04: qty 90, 90d supply, fill #0
  Filled 2024-07-29: qty 90, 90d supply, fill #1
  Filled 2024-09-14: qty 90, 90d supply, fill #0

## 2024-05-17 ENCOUNTER — Encounter: Payer: Self-pay | Admitting: Urology

## 2024-05-17 ENCOUNTER — Other Ambulatory Visit: Payer: Self-pay | Admitting: Urology

## 2024-05-18 ENCOUNTER — Other Ambulatory Visit (HOSPITAL_COMMUNITY): Payer: Self-pay

## 2024-05-18 ENCOUNTER — Other Ambulatory Visit: Payer: Self-pay | Admitting: *Deleted

## 2024-05-18 MED ORDER — TESTOSTERONE CYPIONATE 200 MG/ML IM SOLN
200.0000 mg | INTRAMUSCULAR | 0 refills | Status: DC
  Filled 2024-05-18: qty 6, 84d supply, fill #0

## 2024-05-30 ENCOUNTER — Other Ambulatory Visit (HOSPITAL_COMMUNITY): Payer: Self-pay

## 2024-05-31 ENCOUNTER — Other Ambulatory Visit (HOSPITAL_COMMUNITY): Payer: Self-pay

## 2024-05-31 MED ORDER — ATENOLOL 50 MG PO TABS
50.0000 mg | ORAL_TABLET | Freq: Every day | ORAL | 3 refills | Status: AC
  Filled 2024-05-31: qty 90, 90d supply, fill #0
  Filled 2024-08-29: qty 90, 90d supply, fill #1
  Filled 2024-09-14: qty 90, 90d supply, fill #0

## 2024-05-31 MED ORDER — FENOFIBRATE 160 MG PO TABS
160.0000 mg | ORAL_TABLET | Freq: Every day | ORAL | 3 refills | Status: AC
  Filled 2024-05-31: qty 90, 90d supply, fill #0
  Filled 2024-08-29: qty 90, 90d supply, fill #1
  Filled 2024-09-14: qty 90, 90d supply, fill #0

## 2024-05-31 MED ORDER — ROSUVASTATIN CALCIUM 40 MG PO TABS
40.0000 mg | ORAL_TABLET | Freq: Every day | ORAL | 3 refills | Status: AC
  Filled 2024-05-31: qty 90, 90d supply, fill #0
  Filled 2024-08-29: qty 90, 90d supply, fill #1
  Filled 2024-09-14: qty 90, 90d supply, fill #0

## 2024-06-02 ENCOUNTER — Other Ambulatory Visit (HOSPITAL_COMMUNITY): Payer: Self-pay

## 2024-06-03 ENCOUNTER — Other Ambulatory Visit: Payer: Self-pay

## 2024-06-03 DIAGNOSIS — D0439 Carcinoma in situ of skin of other parts of face: Secondary | ICD-10-CM | POA: Diagnosis not present

## 2024-06-03 MED ORDER — BUTALBITAL-APAP-CAFFEINE 50-325-40 MG PO TABS
1.0000 | ORAL_TABLET | Freq: Every day | ORAL | 0 refills | Status: AC | PRN
Start: 2024-06-03 — End: ?
  Filled 2024-06-03 – 2024-06-04 (×2): qty 45, 45d supply, fill #0

## 2024-06-04 ENCOUNTER — Other Ambulatory Visit: Payer: Self-pay

## 2024-06-04 ENCOUNTER — Other Ambulatory Visit (HOSPITAL_COMMUNITY): Payer: Self-pay

## 2024-08-13 ENCOUNTER — Other Ambulatory Visit: Payer: Self-pay | Admitting: Urology

## 2024-08-13 ENCOUNTER — Other Ambulatory Visit (HOSPITAL_COMMUNITY): Payer: Self-pay

## 2024-08-17 ENCOUNTER — Other Ambulatory Visit (HOSPITAL_COMMUNITY): Payer: Self-pay

## 2024-08-17 ENCOUNTER — Other Ambulatory Visit: Payer: Self-pay | Admitting: Urology

## 2024-08-17 MED ORDER — TESTOSTERONE CYPIONATE 200 MG/ML IM SOLN
200.0000 mg | INTRAMUSCULAR | 0 refills | Status: AC
  Filled 2024-08-17: qty 6, 84d supply, fill #0

## 2024-08-25 ENCOUNTER — Other Ambulatory Visit: Payer: Self-pay

## 2024-08-25 DIAGNOSIS — M51362 Other intervertebral disc degeneration, lumbar region with discogenic back pain and lower extremity pain: Secondary | ICD-10-CM | POA: Diagnosis not present

## 2024-08-25 DIAGNOSIS — M5441 Lumbago with sciatica, right side: Secondary | ICD-10-CM | POA: Diagnosis not present

## 2024-08-25 DIAGNOSIS — M545 Low back pain, unspecified: Secondary | ICD-10-CM | POA: Diagnosis not present

## 2024-08-25 MED ORDER — PREDNISONE 10 MG PO TABS
ORAL_TABLET | ORAL | 0 refills | Status: AC
  Filled 2024-08-25: qty 21, 6d supply, fill #0

## 2024-08-25 MED ORDER — TIZANIDINE HCL 4 MG PO TABS
4.0000 mg | ORAL_TABLET | Freq: Every evening | ORAL | 1 refills | Status: AC | PRN
  Filled 2024-08-25: qty 30, 30d supply, fill #0

## 2024-09-14 ENCOUNTER — Other Ambulatory Visit (HOSPITAL_COMMUNITY): Payer: Self-pay

## 2024-09-15 ENCOUNTER — Other Ambulatory Visit (HOSPITAL_COMMUNITY): Payer: Self-pay

## 2024-09-30 ENCOUNTER — Other Ambulatory Visit

## 2024-09-30 DIAGNOSIS — Z125 Encounter for screening for malignant neoplasm of prostate: Secondary | ICD-10-CM

## 2024-09-30 DIAGNOSIS — E291 Testicular hypofunction: Secondary | ICD-10-CM

## 2024-10-01 LAB — HEMOGLOBIN AND HEMATOCRIT, BLOOD
Hematocrit: 48.7 % (ref 37.5–51.0)
Hemoglobin: 15.7 g/dL (ref 13.0–17.7)

## 2024-10-01 LAB — TESTOSTERONE: Testosterone: 750 ng/dL (ref 264–916)

## 2024-10-02 ENCOUNTER — Ambulatory Visit: Payer: Self-pay | Admitting: Urology

## 2025-03-30 ENCOUNTER — Other Ambulatory Visit

## 2025-04-01 ENCOUNTER — Ambulatory Visit: Admitting: Urology
